# Patient Record
Sex: Male | Born: 1940 | Race: White | Hispanic: No | Marital: Married | Smoking: Former smoker
Health system: Southern US, Community
[De-identification: ages and names within clinical notes are randomized; demographics above are authoritative.]

## PROBLEM LIST (undated history)

## (undated) DIAGNOSIS — E039 Hypothyroidism, unspecified: Secondary | ICD-10-CM

## (undated) DIAGNOSIS — Z8739 Personal history of other diseases of the musculoskeletal system and connective tissue: Secondary | ICD-10-CM

## (undated) DIAGNOSIS — I2119 ST elevation (STEMI) myocardial infarction involving other coronary artery of inferior wall: Secondary | ICD-10-CM

## (undated) DIAGNOSIS — T7840XA Allergy, unspecified, initial encounter: Secondary | ICD-10-CM

## (undated) DIAGNOSIS — E785 Hyperlipidemia, unspecified: Secondary | ICD-10-CM

## (undated) DIAGNOSIS — M1990 Unspecified osteoarthritis, unspecified site: Secondary | ICD-10-CM

## (undated) DIAGNOSIS — I2510 Atherosclerotic heart disease of native coronary artery without angina pectoris: Secondary | ICD-10-CM

## (undated) DIAGNOSIS — I739 Peripheral vascular disease, unspecified: Secondary | ICD-10-CM

## (undated) HISTORY — DX: Hyperlipidemia, unspecified: E78.5

## (undated) HISTORY — DX: Personal history of other diseases of the musculoskeletal system and connective tissue: Z87.39

## (undated) HISTORY — PX: CORONARY ANGIOPLASTY WITH STENT PLACEMENT: SHX49

## (undated) HISTORY — DX: Peripheral vascular disease, unspecified: I73.9

## (undated) HISTORY — PX: APPENDECTOMY: SHX54

## (undated) HISTORY — DX: Hypothyroidism, unspecified: E03.9

## (undated) HISTORY — DX: Unspecified osteoarthritis, unspecified site: M19.90

## (undated) HISTORY — PX: CORONARY STENT PLACEMENT: SHX1402

## (undated) HISTORY — DX: ST elevation (STEMI) myocardial infarction involving other coronary artery of inferior wall: I21.19

## (undated) HISTORY — DX: Atherosclerotic heart disease of native coronary artery without angina pectoris: I25.10

## (undated) HISTORY — PX: HERNIA REPAIR: SHX51

## (undated) HISTORY — DX: Allergy, unspecified, initial encounter: T78.40XA

---

## 1999-05-28 DIAGNOSIS — I2119 ST elevation (STEMI) myocardial infarction involving other coronary artery of inferior wall: Secondary | ICD-10-CM

## 1999-05-28 HISTORY — DX: ST elevation (STEMI) myocardial infarction involving other coronary artery of inferior wall: I21.19

## 2000-04-07 ENCOUNTER — Inpatient Hospital Stay (HOSPITAL_COMMUNITY): Admission: EM | Admit: 2000-04-07 | Discharge: 2000-04-11 | Payer: Self-pay | Admitting: Emergency Medicine

## 2000-04-07 ENCOUNTER — Encounter: Payer: Self-pay | Admitting: Emergency Medicine

## 2000-04-08 HISTORY — PX: CARDIAC CATHETERIZATION: SHX172

## 2000-04-09 ENCOUNTER — Encounter: Payer: Self-pay | Admitting: Cardiology

## 2000-06-10 ENCOUNTER — Encounter (HOSPITAL_COMMUNITY): Admission: RE | Admit: 2000-06-10 | Discharge: 2000-09-08 | Payer: Self-pay | Admitting: *Deleted

## 2004-11-13 ENCOUNTER — Ambulatory Visit: Payer: Self-pay | Admitting: Family Medicine

## 2005-01-17 ENCOUNTER — Ambulatory Visit: Payer: Self-pay | Admitting: Family Medicine

## 2005-01-18 ENCOUNTER — Ambulatory Visit: Payer: Self-pay | Admitting: Family Medicine

## 2005-01-21 ENCOUNTER — Ambulatory Visit: Payer: Self-pay | Admitting: Family Medicine

## 2006-01-02 ENCOUNTER — Ambulatory Visit: Payer: Self-pay | Admitting: Internal Medicine

## 2006-01-07 ENCOUNTER — Ambulatory Visit: Payer: Self-pay | Admitting: Cardiology

## 2006-07-16 ENCOUNTER — Ambulatory Visit: Payer: Self-pay | Admitting: Internal Medicine

## 2006-07-16 LAB — CONVERTED CEMR LAB
ALT: 16 units/L (ref 0–40)
Albumin: 3.8 g/dL (ref 3.5–5.2)
BUN: 17 mg/dL (ref 6–23)
CO2: 30 meq/L (ref 19–32)
Calcium: 9.1 mg/dL (ref 8.4–10.5)
Chloride: 105 meq/L (ref 96–112)
Cholesterol: 213 mg/dL (ref 0–200)
Creatinine, Ser: 0.8 mg/dL (ref 0.4–1.5)
Direct LDL: 151.9 mg/dL
GFR calc Af Amer: 126 mL/min
GFR calc non Af Amer: 104 mL/min
Glucose, Bld: 97 mg/dL (ref 70–99)
HDL: 34.3 mg/dL — ABNORMAL LOW (ref >39.0)
Phosphorus: 2.7 mg/dL (ref 2.3–4.6)
Potassium: 4.4 meq/L (ref 3.5–5.1)
Sodium: 139 meq/L (ref 135–145)
Total CHOL/HDL Ratio: 6.2
Total CK: 64 units/L (ref 7–195)
Triglycerides: 144 mg/dL (ref 0–149)
VLDL: 29 mg/dL (ref 0–40)

## 2006-09-23 ENCOUNTER — Telehealth (INDEPENDENT_AMBULATORY_CARE_PROVIDER_SITE_OTHER): Payer: Self-pay | Admitting: *Deleted

## 2007-01-07 DIAGNOSIS — I2510 Atherosclerotic heart disease of native coronary artery without angina pectoris: Secondary | ICD-10-CM | POA: Insufficient documentation

## 2007-01-07 DIAGNOSIS — E785 Hyperlipidemia, unspecified: Secondary | ICD-10-CM | POA: Insufficient documentation

## 2007-01-07 DIAGNOSIS — I739 Peripheral vascular disease, unspecified: Secondary | ICD-10-CM

## 2007-01-13 ENCOUNTER — Telehealth (INDEPENDENT_AMBULATORY_CARE_PROVIDER_SITE_OTHER): Payer: Self-pay | Admitting: *Deleted

## 2007-03-11 ENCOUNTER — Ambulatory Visit: Payer: Self-pay | Admitting: Internal Medicine

## 2007-04-10 ENCOUNTER — Ambulatory Visit: Payer: Self-pay | Admitting: Internal Medicine

## 2007-04-10 DIAGNOSIS — J309 Allergic rhinitis, unspecified: Secondary | ICD-10-CM | POA: Insufficient documentation

## 2007-04-13 LAB — CONVERTED CEMR LAB
ALT: 13 units/L (ref 0–53)
Albumin: 3.3 g/dL — ABNORMAL LOW (ref 3.5–5.2)
BUN: 11 mg/dL (ref 6–23)
CO2: 28 meq/L (ref 19–32)
Calcium: 8.8 mg/dL (ref 8.4–10.5)
Chloride: 105 meq/L (ref 96–112)
Cholesterol: 189 mg/dL (ref 0–200)
Creatinine, Ser: 0.8 mg/dL (ref 0.4–1.5)
GFR calc Af Amer: 126 mL/min
GFR calc non Af Amer: 104 mL/min
Glucose, Bld: 101 mg/dL — ABNORMAL HIGH (ref 70–99)
HDL: 31.8 mg/dL — ABNORMAL LOW (ref >39.0)
LDL Cholesterol: 141 mg/dL — ABNORMAL HIGH (ref 0–99)
Phosphorus: 2.3 mg/dL (ref 2.3–4.6)
Potassium: 3.9 meq/L (ref 3.5–5.1)
Sodium: 139 meq/L (ref 135–145)
Total CHOL/HDL Ratio: 5.9
Triglycerides: 79 mg/dL (ref 0–149)
VLDL: 16 mg/dL (ref 0–40)

## 2008-03-11 ENCOUNTER — Telehealth: Payer: Self-pay | Admitting: Internal Medicine

## 2008-04-07 ENCOUNTER — Ambulatory Visit: Payer: Self-pay | Admitting: Internal Medicine

## 2008-04-07 DIAGNOSIS — M719 Bursopathy, unspecified: Secondary | ICD-10-CM

## 2008-04-07 DIAGNOSIS — M67919 Unspecified disorder of synovium and tendon, unspecified shoulder: Secondary | ICD-10-CM | POA: Insufficient documentation

## 2008-04-13 LAB — CONVERTED CEMR LAB
ALT: 15 units/L (ref 0–53)
AST: 18 units/L (ref 0–37)
Albumin: 3.6 g/dL (ref 3.5–5.2)
Alkaline Phosphatase: 68 units/L (ref 39–117)
BUN: 11 mg/dL (ref 6–23)
Basophils Absolute: 0.1 10*3/uL (ref 0.0–0.1)
Basophils Relative: 0.7 % (ref 0.0–3.0)
Bilirubin, Direct: 0.1 mg/dL (ref 0.0–0.3)
CO2: 29 meq/L (ref 19–32)
Calcium: 9.3 mg/dL (ref 8.4–10.5)
Chloride: 108 meq/L (ref 96–112)
Cholesterol: 199 mg/dL (ref 0–200)
Creatinine, Ser: 0.8 mg/dL (ref 0.4–1.5)
Eosinophils Absolute: 0.4 10*3/uL (ref 0.0–0.7)
Eosinophils Relative: 5.6 % — ABNORMAL HIGH (ref 0.0–5.0)
GFR calc Af Amer: 125 mL/min
GFR calc non Af Amer: 103 mL/min
Glucose, Bld: 91 mg/dL (ref 70–99)
HCT: 38.4 % — ABNORMAL LOW (ref 39.0–52.0)
HDL: 21.1 mg/dL — ABNORMAL LOW (ref >39.0)
Hemoglobin: 13.1 g/dL (ref 13.0–17.0)
LDL Cholesterol: 148 mg/dL — ABNORMAL HIGH (ref 0–99)
Lymphocytes Relative: 17.1 % (ref 12.0–46.0)
MCHC: 34.1 g/dL (ref 30.0–36.0)
MCV: 91.5 fL (ref 78.0–100.0)
Monocytes Absolute: 0.4 10*3/uL (ref 0.1–1.0)
Monocytes Relative: 5.4 % (ref 3.0–12.0)
Neutro Abs: 5.2 10*3/uL (ref 1.4–7.7)
Neutrophils Relative %: 71.2 % (ref 43.0–77.0)
Phosphorus: 3.8 mg/dL (ref 2.3–4.6)
Platelets: 347 10*3/uL (ref 150–400)
Potassium: 4.4 meq/L (ref 3.5–5.1)
RBC: 4.2 M/uL — ABNORMAL LOW (ref 4.22–5.81)
RDW: 14.2 % (ref 11.5–14.6)
Sodium: 141 meq/L (ref 135–145)
TSH: 9.27 microintl units/mL — ABNORMAL HIGH (ref 0.35–5.50)
Total Bilirubin: 0.6 mg/dL (ref 0.3–1.2)
Total CHOL/HDL Ratio: 9.4
Total Protein: 6.5 g/dL (ref 6.0–8.3)
Triglycerides: 152 mg/dL — ABNORMAL HIGH (ref 0–149)
VLDL: 30 mg/dL (ref 0–40)
WBC: 7.3 10*3/uL (ref 4.5–10.5)

## 2008-04-29 ENCOUNTER — Ambulatory Visit: Payer: Self-pay | Admitting: Internal Medicine

## 2008-04-29 DIAGNOSIS — E039 Hypothyroidism, unspecified: Secondary | ICD-10-CM

## 2008-04-29 DIAGNOSIS — R946 Abnormal results of thyroid function studies: Secondary | ICD-10-CM

## 2008-04-29 LAB — CONVERTED CEMR LAB
Free T4: 0.7 ng/dL (ref 0.6–1.6)
TSH: 7.92 microintl units/mL — ABNORMAL HIGH (ref 0.35–5.50)

## 2008-05-17 ENCOUNTER — Ambulatory Visit: Payer: Self-pay | Admitting: Internal Medicine

## 2008-05-17 DIAGNOSIS — M1990 Unspecified osteoarthritis, unspecified site: Secondary | ICD-10-CM | POA: Insufficient documentation

## 2008-05-25 ENCOUNTER — Encounter: Payer: Self-pay | Admitting: Internal Medicine

## 2008-06-21 ENCOUNTER — Telehealth: Payer: Self-pay | Admitting: Internal Medicine

## 2008-06-24 ENCOUNTER — Telehealth: Payer: Self-pay | Admitting: Internal Medicine

## 2008-06-27 ENCOUNTER — Ambulatory Visit: Payer: Self-pay | Admitting: Internal Medicine

## 2008-06-27 DIAGNOSIS — M129 Arthropathy, unspecified: Secondary | ICD-10-CM | POA: Insufficient documentation

## 2008-06-29 ENCOUNTER — Encounter (INDEPENDENT_AMBULATORY_CARE_PROVIDER_SITE_OTHER): Payer: Self-pay | Admitting: Family Medicine

## 2008-06-29 ENCOUNTER — Ambulatory Visit: Admission: RE | Admit: 2008-06-29 | Discharge: 2008-06-29 | Payer: Self-pay | Admitting: Family Medicine

## 2008-06-29 ENCOUNTER — Encounter: Payer: Self-pay | Admitting: Internal Medicine

## 2008-06-29 ENCOUNTER — Ambulatory Visit: Payer: Self-pay | Admitting: Surgery

## 2008-07-20 ENCOUNTER — Encounter: Payer: Self-pay | Admitting: Internal Medicine

## 2008-09-29 ENCOUNTER — Encounter: Payer: Self-pay | Admitting: Internal Medicine

## 2008-09-29 ENCOUNTER — Ambulatory Visit (HOSPITAL_COMMUNITY): Admission: RE | Admit: 2008-09-29 | Discharge: 2008-09-29 | Payer: Self-pay | Admitting: Rheumatology

## 2008-11-23 ENCOUNTER — Encounter: Payer: Self-pay | Admitting: Internal Medicine

## 2008-11-24 HISTORY — PX: OTHER SURGICAL HISTORY: SHX169

## 2008-11-29 ENCOUNTER — Ambulatory Visit: Payer: Self-pay | Admitting: *Deleted

## 2008-12-05 ENCOUNTER — Encounter: Payer: Self-pay | Admitting: Internal Medicine

## 2008-12-06 ENCOUNTER — Encounter: Payer: Self-pay | Admitting: Internal Medicine

## 2008-12-08 ENCOUNTER — Encounter: Payer: Self-pay | Admitting: Internal Medicine

## 2009-01-05 ENCOUNTER — Encounter: Payer: Self-pay | Admitting: Internal Medicine

## 2009-01-17 ENCOUNTER — Encounter (INDEPENDENT_AMBULATORY_CARE_PROVIDER_SITE_OTHER): Payer: Self-pay | Admitting: Orthopedic Surgery

## 2009-01-17 ENCOUNTER — Ambulatory Visit (HOSPITAL_BASED_OUTPATIENT_CLINIC_OR_DEPARTMENT_OTHER): Admission: RE | Admit: 2009-01-17 | Discharge: 2009-01-17 | Payer: Self-pay | Admitting: Orthopedic Surgery

## 2009-02-08 ENCOUNTER — Encounter: Payer: Self-pay | Admitting: Internal Medicine

## 2009-02-14 ENCOUNTER — Ambulatory Visit (HOSPITAL_BASED_OUTPATIENT_CLINIC_OR_DEPARTMENT_OTHER): Admission: RE | Admit: 2009-02-14 | Discharge: 2009-02-14 | Payer: Self-pay | Admitting: Orthopedic Surgery

## 2009-02-17 ENCOUNTER — Encounter: Payer: Self-pay | Admitting: Internal Medicine

## 2009-02-20 ENCOUNTER — Encounter: Admission: RE | Admit: 2009-02-20 | Discharge: 2009-02-20 | Payer: Self-pay | Admitting: Endocrinology

## 2009-03-17 ENCOUNTER — Encounter: Payer: Self-pay | Admitting: Internal Medicine

## 2009-04-05 ENCOUNTER — Encounter: Payer: Self-pay | Admitting: Internal Medicine

## 2009-04-13 ENCOUNTER — Ambulatory Visit: Payer: Self-pay | Admitting: Internal Medicine

## 2009-04-13 DIAGNOSIS — D649 Anemia, unspecified: Secondary | ICD-10-CM

## 2009-04-13 DIAGNOSIS — M353 Polymyalgia rheumatica: Secondary | ICD-10-CM

## 2009-04-14 ENCOUNTER — Telehealth: Payer: Self-pay | Admitting: Internal Medicine

## 2009-04-14 LAB — CONVERTED CEMR LAB
Basophils Absolute: 0 10*3/uL (ref 0.0–0.1)
Basophils Relative: 0.2 % (ref 0.0–3.0)
Eosinophils Absolute: 0.6 10*3/uL (ref 0.0–0.7)
Eosinophils Relative: 10.8 % — ABNORMAL HIGH (ref 0.0–5.0)
Ferritin: 101.8 ng/mL (ref 22.0–322.0)
Folate: 10.6 ng/mL
HCT: 37.6 % — ABNORMAL LOW (ref 39.0–52.0)
Hemoglobin: 12.3 g/dL — ABNORMAL LOW (ref 13.0–17.0)
Iron: 21 ug/dL — ABNORMAL LOW (ref 42–165)
Lymphocytes Relative: 17.7 % (ref 12.0–46.0)
Lymphs Abs: 1 10*3/uL (ref 0.7–4.0)
MCHC: 32.6 g/dL (ref 30.0–36.0)
MCV: 87.2 fL (ref 78.0–100.0)
Monocytes Absolute: 0.8 10*3/uL (ref 0.1–1.0)
Monocytes Relative: 15.3 % — ABNORMAL HIGH (ref 3.0–12.0)
Neutro Abs: 3.1 10*3/uL (ref 1.4–7.7)
Neutrophils Relative %: 56 % (ref 43.0–77.0)
Platelets: 419 10*3/uL — ABNORMAL HIGH (ref 150.0–400.0)
RBC: 4.31 M/uL (ref 4.22–5.81)
RDW: 16.1 % — ABNORMAL HIGH (ref 11.5–14.6)
Saturation Ratios: 8.7 % — ABNORMAL LOW (ref 20.0–50.0)
Transferrin: 171.6 mg/dL — ABNORMAL LOW (ref 212.0–360.0)
Vitamin B-12: 324 pg/mL (ref 211–911)
WBC: 5.5 10*3/uL (ref 4.5–10.5)

## 2009-04-26 ENCOUNTER — Encounter: Payer: Self-pay | Admitting: Internal Medicine

## 2009-05-08 ENCOUNTER — Encounter (INDEPENDENT_AMBULATORY_CARE_PROVIDER_SITE_OTHER): Payer: Self-pay | Admitting: *Deleted

## 2009-07-27 ENCOUNTER — Encounter (INDEPENDENT_AMBULATORY_CARE_PROVIDER_SITE_OTHER): Payer: Self-pay | Admitting: *Deleted

## 2009-08-25 ENCOUNTER — Ambulatory Visit: Payer: Self-pay | Admitting: Gastroenterology

## 2009-11-16 ENCOUNTER — Ambulatory Visit: Payer: Self-pay | Admitting: Internal Medicine

## 2009-11-16 ENCOUNTER — Encounter (INDEPENDENT_AMBULATORY_CARE_PROVIDER_SITE_OTHER): Payer: Self-pay | Admitting: *Deleted

## 2009-11-20 LAB — CONVERTED CEMR LAB
ALT: 14 units/L (ref 0–53)
AST: 20 units/L (ref 0–37)
Cholesterol: 194 mg/dL (ref 0–200)
Free T4: 0.74 ng/dL (ref 0.60–1.60)
HDL: 34.2 mg/dL — ABNORMAL LOW (ref >39.00)
PSA: 0.44 ng/mL (ref 0.10–4.00)
Total Protein: 6.9 g/dL (ref 6.0–8.3)
VLDL: 25.6 mg/dL (ref 0.0–40.0)

## 2009-12-11 ENCOUNTER — Ambulatory Visit: Payer: Self-pay | Admitting: Internal Medicine

## 2009-12-13 ENCOUNTER — Encounter (INDEPENDENT_AMBULATORY_CARE_PROVIDER_SITE_OTHER): Payer: Self-pay | Admitting: *Deleted

## 2009-12-13 LAB — CONVERTED CEMR LAB: Fecal Occult Bld: NEGATIVE

## 2009-12-18 ENCOUNTER — Telehealth: Payer: Self-pay | Admitting: Internal Medicine

## 2010-02-09 ENCOUNTER — Encounter: Payer: Self-pay | Admitting: Internal Medicine

## 2010-02-09 ENCOUNTER — Ambulatory Visit: Payer: Self-pay | Admitting: Cardiology

## 2010-06-26 NOTE — Letter (Signed)
Summary: CMA Hemoccult Letter  [facility]  [address] [place], [place] [address]   Phone: [phone]  Fax: [phone]         November 16, 2009 MRN: [id number]    Darin [name] [address] [place], [place]  [address]    Dear Mr. Apperson,     Despite repeated attempts, I have been unable to reach you by phone. At your last visit, Dr. Christella Hartigan requested that you complete the hemoccult cards given to you at your last visit. However, as of yet, we have not received them. Please follow the instructions on the inside cover and return them as soon as possible.If you have misplaced the hemoccult cards, please call me at [phone] and I will mail you new cards. Your health is very important to [place].These tests will help ensure that Dr. Christella Hartigan has all the information at his disposal to make a complete diagnosis for you.  Thank you for your prompt attention to this matter.   Sincerely,    Chales Abrahams CMA (AAMA)  Appended Document: CMA Hemoccult Letter letter mailed

## 2010-06-26 NOTE — Letter (Signed)
Summary: [facility]: Immunoassay Fecal Occult Blood (iFOB) Order Form  [facility]  [address] [place], [place] [address]   Phone: [phone]  Fax: [phone]      [facility]: Immunoassay Fecal Occult Blood (iFOB) Order Form   November 16, 2009 MRN: [id number]   Darin [name] 03-15-1941   Physicican Name:________[place]_________________  Diagnosis Code:_________V76.51_________________      Cindee Salt MD

## 2010-06-26 NOTE — Assessment & Plan Note (Signed)
History of Present Illness Visit Type: Initial Visit Primary GI MD: Rob Bunting MD Primary Provider: Tillman Abide, MD Requesting Provider: Tillman Abide, MD Chief Complaint: iron deficiency anemia History of Present Illness:     very pleasant 70 year old Darin [name] who was recently found to have mild anemia, hemoglobin 11.9. Iron studies were done and this showed that he does have low iron levels, his ferritin however is normal. She has no overt GI symptoms. He has had no bleeding, constipation, diarrhea. He has never had colonoscopy. I don't think that is never completed stool cards.           Current Medications (verified): 1)  Altace 10 Mg Caps (Ramipril) .Marland Kitchen.. 1 Daily 2)  Lopressor 50 Mg Tabs (Metoprolol Tartrate) .... 1/2 Twice A Day 3)  Simvastatin 40 Mg Tabs (Simvastatin) .Marland Kitchen.. 1 Daily 4)  Aspirin 325 Mg  Tbec (Aspirin) .... Take One By Mouth Once A Day 5)  Multivitamins   Tabs (Multiple Vitamin) .... Take One By Mouth Once A Day 6)  Vitamin C  Allergies (verified): 1)  Lipitor (Atorvastatin Calcium) 2)  Codeine Sulfate (Codeine Sulfate)  Past History:  Past Medical History: Coronary artery disease Hyperlipidemia Peripheral vascular disease Allergic rhinitis Hypothyroidism Osteoarthritis Polymyalgia rheumatica? Seronegative rheumatoid arthritis?   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  All other review of systems was otherwise  negative.   Vital Signs:  Patient profile:   70 year old male Height:      67 inches Weight:      216 pounds BMI:     33.95 Pulse rate:   84 / minute Pulse rhythm:   regular BP sitting:   138 / 84  (left arm)  Vitals Entered By: Milford Cage NCMA (August 25, 2009 9:39 AM)  Physical Exam  Additional Exam:  Constitutional: generally well appearing Psychiatric: alert and oriented times 3 Eyes: extraocular movements intact Mouth: oropharynx moist, no lesions Neck: supple, no lymphadenopathy Cardiovascular: heart regular rate and rythm Lungs: CTA bilaterally Abdomen: soft, non-tender, non-distended, no obvious ascites, no peritoneal signs, normal bowel sounds Extremities: no lower extremity edema bilaterally Skin: no lesions on visible extremities    Impression & Recommendations:  Problem # 1:  Iron deficiency anemia, mild he is a very reluctant to undergo colon cancer screening. I recommended him in no one clear terms that he should have a colonoscopy since he is iron deficient. I told him he may have a cancer that is causing chronic GI blood loss leading to anemia. He is still very reluctant. I did have a very pleasant conversation with him about this and he agreed to think about my recommendation. I did offer him fecal occult blood testing which she will complete forms. I let him know that my recommendation following the fecal occult testing will still be the same, that is I will recommend he needs a colonoscopy. However he believes that if he has microscopic blood in his  stool, proven by one of these tests, he will agree to a colonoscopy.  Patient Instructions: 1)  Dr. Christella Hartigan recommends that you have a colonoscopy.  Since you have declined, please complete 2 sets of stools cards for microscpic blood in your stool. 2)  A copy of this information will be sent to Dr. Alphonsus Sias. 3)  The medication list was reviewed and reconciled.  All changed / newly prescribed medications were  explained.  A complete medication list was provided to the patient / caregiver.  Appended Document: Orders Update/Stool cards    Clinical Lists Changes  Orders: Added new Test order of [facility] Hemoccult Cards #6 (take home) (Hem Cards #6) - Signed

## 2010-06-26 NOTE — Letter (Signed)
Summary: Letter Regarding Disease Mgmt Program/[facility]  Letter Regarding Disease Mgmt Program/[facility]   Imported By: Lanelle Bal 12/01/2009 11:25:06  _____________________________________________________________________  External Attachment:    Type:   Image     Comment:   External Document

## 2010-06-26 NOTE — Letter (Signed)
Summary: Results Follow up Letter  [facility]  [address]   [place], [place] [address]   Phone: [phone]  Fax: [phone]    12/13/2009 MRN: [id number]  Darin [name] [address] [place], [place]  [address]  Dear Mr. Fogarty,  The following are the results of your recent test(s):  Test         Result    Pap Smear:        Normal _____  Not Normal _____ Comments: ______________________________________________________ Cholesterol: LDL(Bad cholesterol):         Your goal is less than:         HDL (Good cholesterol):       Your goal is more than: Comments:  ______________________________________________________ Mammogram:        Normal _____  Not Normal _____ Comments:  ___________________________________________________________________ Hemoccult:        Normal __x___  Not normal _______ Comments:   Will repeat again in 1 year _____________________________________________________________________ Other Tests:    We routinely do not discuss normal results over the telephone.  If you desire a copy of the results, or you have any questions about this information we can discuss them at your next office visit.   Sincerely,      Tillman Abide, MD

## 2010-06-26 NOTE — Assessment & Plan Note (Signed)
Summary: CPX / LFW   Vital Signs:  Patient profile:   70 year old male Weight:      214 pounds Temp:     98.4 degrees F oral Pulse rate:   76 / minute Pulse rhythm:   regular BP sitting:   110 / 60  (left arm) Cuff size:   large  Vitals Entered By: Mervin Hack CMA Duncan Dull) (November 16, 2009 2:06 PM) CC: adult physical   History of Present Illness: Discussed his hand and joint problems still on prednisone though weaning down still has swelling in joints Dr Dagoberto Ligas hopes that will just get better muscle pain is much better on the androgel Still symptomatic but better  Last Hgb 13 at Dr Dagoberto Ligas the last time  No other concerns  Preventive Screening-Counseling & Management  Alcohol-Tobacco     Smoking Status: quit > 6 months     Smoking Cessation Counseling: no  Allergies: 1)  Lipitor (Atorvastatin Calcium) 2)  Codeine Sulfate (Codeine Sulfate)  Past History:  Past medical, surgical, family and social histories (including risk factors) reviewed for relevance to current acute and chronic problems.  Past Medical History: Reviewed history from 08/25/2009 and no changes required. Coronary artery disease Hyperlipidemia Peripheral vascular disease Allergic rhinitis Hypothyroidism Osteoarthritis Polymyalgia rheumatica? Seronegative rheumatoid arthritis?   Past Surgical History: Reviewed history from 08/25/2009 and no changes required. Stents x 3 11/01 Appendix 1962 RIH 1980's Carpal tunnel repair bilaterally--2010 --  Dr Teressa Senter    Family History: Reviewed history from 08/25/2009 and no changes required. Father: Died at age 15, lung cancer, had HTN Mother: Alive-- osteoporosis, HTN 1 brother --has HTN DM in Mat GF No prostate or colon cancer   Social History: Reviewed history from 08/25/2009 and no changes required. 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 Eyes:  Denies double vision and vision loss-1 eye. ENT:  Denies decreased hearing and ringing in ears; teeth okay--regular with dentist. CV:  Denies chest pain or discomfort, difficulty breathing at night, difficulty breathing while lying down, fainting, lightheadness, palpitations, and shortness of breath with exertion. Resp:  Denies cough and shortness of breath. GI:  Denies abdominal pain, bloody stools, change in bowel habits, dark tarry stools, indigestion, loss of appetite, nausea, and vomiting. GU:  Complains of erectile dysfunction; denies urinary frequency and urinary hesitancy; mild ED. MS:  See HPI; Complains of joint pain, joint swelling, muscle aches, and muscle weakness. Derm:  Complains of dryness; denies lesion(s) and rash. Neuro:  Complains of weakness; denies headaches, numbness, and tingling; notes some trouble getting up from floor if on his knees. Psych:  Denies anxiety and depression. Heme:  Denies abnormal bruising and enlarge lymph nodes. Allergy:  Complains of seasonal allergies and sneezing; trouble when mowing--benedryl not much help.  Physical Exam  General:  alert and normal appearance.   Eyes:  pupils equal, pupils round, and pupils reactive to light.   Ears:  R ear normal and L ear normal.   Mouth:  no erythema, no exudates, and no lesions.   Neck:  supple, no masses, no thyromegaly, no carotid bruits, and no cervical lymphadenopathy.   Lungs:  normal respiratory effort and normal breath sounds.  Heart:  normal rate, regular rhythm, no murmur, and no gallop.   Abdomen:  soft, non-tender, and no masses.   Rectal:  he preferred not to have this exam Msk:  some swelling in right wrist and MCPs, and left knee Pulses:  1+ in  feet Extremities:  no edema Neurologic:  alert & oriented X3 and gait normal.   Skin:  no rashes and no suspicious lesions.   Axillary Nodes:  No palpable lymphadenopathy Psych:  normally interactive, good eye contact, not anxious appearing, and not depressed appearing.     Impression & Recommendations:  Problem # 1:  PREVENTIVE HEALTH CARE (ICD-V70.0) Assessment Comment Only still prefers not to have colonoscopy will give stool immunoassay since it is easier than hemoccult cards will check PSA  Problem # 2:  POLYMYALGIA RHEUMATICA (ICD-725) Assessment: Comment Only better but complex situation joint swelling doesn't necessarily go along with this Dr Dagoberto Ligas managing  His updated medication list for this problem includes:    Prednisone 5 Mg Tabs (Prednisone) .Marland Kitchen... Take 1/2 tab by mouth once daily    Aspirin 325 Mg Tbec (Aspirin) .Marland Kitchen... Take one by mouth once a day  Problem # 3:  CORONARY ARTERY DISEASE (ICD-414.00) Assessment: Comment Only quiet Dr [place] manages  His updated medication list for this problem includes:    Altace 10 Mg Caps (Ramipril) .Marland Kitchen... 1 daily    Lopressor 50 Mg Tabs (Metoprolol tartrate) .Marland Kitchen... 1/2 twice a day    Aspirin 325 Mg Tbec (Aspirin) .Marland Kitchen... Take one by mouth once a day  Problem # 4:  HYPERLIPIDEMIA (ICD-272.4) Assessment: Unchanged  will check labs  His updated medication list for this problem includes:    Simvastatin 40 Mg Tabs (Simvastatin) .Marland Kitchen... 1 daily  Labs Reviewed: SGOT: 18 (04/07/2008)   SGPT: 15 (04/07/2008)  Prior 10 Yr Risk Heart Disease: N/A (04/13/2009)   HDL:21.1 (04/07/2008), 31.8 (04/10/2007)  LDL:148 (04/07/2008), 141 (04/10/2007)  Chol:199 (04/07/2008), 189 (04/10/2007)  Trig:152 (04/07/2008), 79 (04/10/2007)  Orders: TLB-Lipid Panel (80061-LIPID) TLB-Hepatic/Liver Function Pnl (80076-HEPATIC) Venipuncture ([address])  Complete Medication List: 1)  Altace 10 Mg Caps (Ramipril) .Marland Kitchen.. 1 daily 2)  Lopressor 50 Mg Tabs  (Metoprolol tartrate) .... 1/2 twice a day 3)  Simvastatin 40 Mg Tabs (Simvastatin) .Marland Kitchen.. 1 daily 4)  Vitamin C 500 Mg Tabs (Ascorbic acid) .... Take 1 by mouth once daily 5)  Prednisone 5 Mg Tabs (Prednisone) .... Take 1/2 tab by mouth once daily 6)  Androgel 25 Mg/2.5gm Gel (Testosterone) .... As needed 7)  Aspirin 325 Mg Tbec (Aspirin) .... Take one by mouth once a day 8)  Multivitamins Tabs (Multiple vitamin) .... Take one by mouth once a day  Other Orders: TLB-T4 (Thyrox), Free [phone]) TLB-PSA (Prostate Specific Antigen) (84153-PSA)  Patient Instructions: 1)  Please schedule a follow-up appointment in 1 year.  2)  Complete your hemoccult cards and return them soon.   Current Allergies (reviewed today): LIPITOR (ATORVASTATIN CALCIUM) CODEINE SULFATE (CODEINE SULFATE)

## 2010-06-26 NOTE — Progress Notes (Signed)
Summary: inner ear  Phone Note Call from Patient Call back at Home Phone [phone]   Caller: Spouse Call For: Cindee Salt MD Summary of Call: Wife called saying that patient is having trouble with inner ear since yesterday. Wife is asking if he could have something called [place] without coming in for appt. because he was just in last week. Please advise.  Initial call taken by: Melody Comas,  December 18, 2009 11:14 AM  Follow-up for Phone Call        If he is having vertigo or balance issues, he can try meclizine 25mg  three times a day. THis is OTC and sold under various brand names for motion sickness (like bonine) Appt if worsens or not getting better over time Follow-up by: Cindee Salt MD,  December 18, 2009 12:40 PM  Additional Follow-up for Phone Call Additional follow up Details #1::        Patient's wife notified as instructed by telephone. Additional Follow-up by: Sydell Axon LPN,  December 18, 2009 2:21 PM

## 2010-06-26 NOTE — Letter (Signed)
Summary: New Patient letter  [facility]  [address] [place], [place] [address]   Phone: [phone]  Fax: [phone]       07/27/2009 MRN: [id number]  Darin [name] [address] [place], [place]  [address]  Dear Darin [name],  Welcome to the Gastroenterology Division at [facility].    You are scheduled to see Dr. Christella Hartigan on 08-25-09 at 10:00a.m. on the 3rd floor at [facility], [address]  We ask that you try to arrive at our office 15 minutes prior to your appointment time to allow for check-in.  We would like you to complete the enclosed self-administered evaluation form prior to your visit and bring it with you on the day of your appointment.  We will review it with you.  Also, please bring a complete list of all your medications or, if you prefer, bring the medication bottles and we will list them.  Please bring your insurance card so that we may make a copy of it.  If your insurance requires a referral to see a specialist, please bring your referral form from your primary care physician.  Co-payments are due at the time of your visit and may be paid by cash, check or credit card.     Your office visit will consist of a consult with your physician (includes a physical exam), any laboratory testing he/she may order, scheduling of any necessary diagnostic testing (e.g. x-ray, ultrasound, CT-scan), and scheduling of a procedure (e.g. Endoscopy, Colonoscopy) if required.  Please allow enough time on your schedule to allow for any/all of these possibilities.    If you cannot keep your appointment, please call [phone] to cancel or reschedule prior to your appointment date.  This allows [place] the opportunity to schedule an appointment for another patient in need of care.  If you do not cancel or reschedule by 5 p.m. the business day prior to your appointment date, you will be charged a $50.00 late cancellation/no-show fee.    Thank you for choosing [facility] for your medical needs.  We appreciate the opportunity to care for you.  Please visit [place] at our website  to learn more about our practice.                     Sincerely,                                                             The Gastroenterology Division

## 2010-06-26 NOTE — Letter (Signed)
Summary: [facility]   Imported By: Lester Granite 02/22/2010 07:29:06  _____________________________________________________________________  External Attachment:    Type:   Image     Comment:   External Document  Appended Document: [facility] No changes 6 month follow up

## 2010-08-31 LAB — BASIC METABOLIC PANEL
BUN: 9 mg/dL (ref 6–23)
CO2: 26 mEq/L (ref 19–32)
Chloride: 104 mEq/L (ref 96–112)
Glucose, Bld: 97 mg/dL (ref 70–99)
Potassium: 4 mEq/L (ref 3.5–5.1)
Sodium: 138 mEq/L (ref 135–145)

## 2010-09-01 LAB — BASIC METABOLIC PANEL
BUN: 10 mg/dL (ref 6–23)
CO2: 25 mEq/L (ref 19–32)
Chloride: 105 mEq/L (ref 96–112)
Glucose, Bld: 125 mg/dL — ABNORMAL HIGH (ref 70–99)
Potassium: 4.6 mEq/L (ref 3.5–5.1)
Sodium: 137 mEq/L (ref 135–145)

## 2010-09-01 LAB — POCT HEMOGLOBIN-HEMACUE
Hemoglobin: 11 g/dL — ABNORMAL LOW (ref 13.0–17.0)
Hemoglobin: 8.9 g/dL — ABNORMAL LOW (ref 13.0–17.0)

## 2010-09-03 ENCOUNTER — Encounter: Payer: Self-pay | Admitting: Internal Medicine

## 2010-10-09 NOTE — Op Note (Signed)
NAME:  [name], INKS NO.:  [id number]   MEDICAL RECORD NO.:  [id number]          PATIENT TYPE:  AMB   LOCATION:  [facility]SC                          FACILITY:  [facility]   PHYSICIAN:  Katy Fitch. Sypher, M.D. DATE OF BIRTH:  08-21-1940   DATE OF PROCEDURE:  01/17/2009  DATE OF DISCHARGE:                               OPERATIVE REPORT   PREOPERATIVE DIAGNOSIS:  Severe right carpal tunnel syndrome with  background seronegative inflammatory arthritis, rule out rheumatoid  arthritis.   POSTOPERATIVE DIAGNOSIS:  Profound inflammatory tenosynovitis with very  severe right carpal tunnel syndrome.   OPERATION:  1. Synovectomy for culture, crystal examination, and histopathology,      right ulnar bursa.  2. Decompression of right median nerve at carpal tunnel.   OPERATING SURGEON:  Katy Fitch. Sypher, MD.   ASSISTANT:  Darin Reeks Dasnoit, PA.   ANESTHESIA:  General by LMA.   SUPERVISING ANESTHESIOLOGIST:  Bedelia Person, MD   INDICATIONS:  Darin [name] is a 70 year old gentleman referred through  the courtesy of Dr. Alphonsus Sias and Dr. Renae Fickle.   He has been undergoing a 21-month medical evaluation for arthralgia,  swelling, pain, and numbness.  He has been seen by Dr. Corliss Skains, who  diagnosed seronegative rheumatoid arthritis.  He was advised to use  Plaquenil, methotrexate, and episodic prednisone.  Prednisone  transiently relieved his symptoms.  He was seen by Dr. Renae Fickle for a second  opinion.  Dr. Renae Fickle noted severe carpal tunnel syndrome, treated with him  steroid injections.  He then referred him to Dr. Regino Schultze for  electrodiagnostic studies.  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   Darin [name] was referred for Hand Surgery consult to consider carpal  tunnel release.  Upon evaluation, it was clear that he had an  inflammatory disorder,  probably rheumatoid arthritis.  He did have an  elevated C-reactive protein obtained by Dr. Dagoberto Ligas.  He does have  background hypothyroidism compensated on levothyroxine.   We advised proceeding with synovial biopsy and decompression of the  median nerve at this time.   We are very careful to point out preoperatively that carpal tunnel  surgery is for symptomatic relief of his burning hand pain and numbness.  It will not address his underlying arthritis.  We urged him to return to  see Dr. Corliss Skains for continuation of treatment of his rheumatoid  arthritis.   Questions were invited and answered in detail both in the office and in  the holding area.   PROCEDURE:  Darin [name] was brought to the operating room and  placed in supine position on the operating table.   Following the induction of general anesthesia by LMA, the right arm was  prepped with Betadine soap and solution, sterilely draped.  A pneumatic  tourniquet was applied to the proximal right brachium.   Following exsanguination of the right arm with an Esmarch bandage, the  arterial tourniquet was inflated to 220 mmHg.  Procedure commenced with  a  short incision in the line of the ring finger in the palm.  Subcutaneous tissues were carefully divided taking care to identify the  palmar fascia.  This was split with scissors to reveal the common  sensory branch of the median nerve in the thenar musculature.  There was  a distal thenar muscle, therefore the muscle fibers were teased apart to  look for aberrant motor branch.  The ulnar bursa was identified and  found be extremely edematous, hypervascular, and a bulging distal to the  margin of the transcarpal ligament.   Transcarpal ligament was released along its ulnar border extending to  this forearm.  This allowed wide spread of the leaflets of the  transverse carpal ligament and immediately inflammatory tenosynovium  fluid was encountered.   This was cultured for  aerobic growth.  A generous synovial biopsy was  accomplished by complete tenosynovectomy of the superficialis profundus  fingers of the long, ring, and small fingers.  This was apportioned into  absolute alcohol for crystal exam and sent in formalin for formal  histopathologic evaluation.   Bleeding points were electrocauterized with bipolar current followed by  exploration to be certain that there are no other masses or predicaments  in the canal.  The wound was then repaired with intradermal 3-0 Prolene.  A compressive dressing was applied with Steri-Strips, sterile gauze, and  a volar plaster splint.   Lidocaine 2% was infiltrated along the wound margins for postoperative  analgesia.   Darin [name] tolerated the surgery and anesthesia well.  He was discharged  with a prescription for Percocet 5 mg one p.o. q.4-6 hours p.r.n. pain.  He needs to contact Dr. Corliss Skains urgently for followup evaluation.  He  may benefit from postoperative steroid; however, due to infection risk,  we will not begin prednisone immediately.   Questions regarding his procedure were invited and answered in detail  with his wife postoperatively.   I will see him back for followup in 7 days for dressing change.      Katy Fitch Sypher, M.D.  Electronically Signed     RVS/MEDQ  D:  01/17/2009  T:  01/17/2009  Job:  [id number]   cc:   Pollyann Savoy, M.D.  Alfonse Alpers. Dagoberto Ligas, M.D.  Deidre Ala, M.D.  Karie Schwalbe, MD

## 2010-10-12 NOTE — Cardiovascular Report (Signed)
[place]  Patient:    TANVIR, HIPPLE                     MRN: [id number] Proc. Date: 04/07/00 Adm. Date:  [id number] Disc. Date: [id number] Attending:  Pricilla Riffle CC:         Dietrich Pates, M.D. [facility]  [facility]   Cardiac Catheterization  PROCEDURES PERFORMED: 1.  Left heart catheterization. 2.  Left ventriculogram. 3.  Selective coronary angiography. 4.  Percutaneous transluminal coronary angioplasty and stenting of the mid     right coronary artery. 5.  Percutaneous transluminal coronary angioplasty of the mid AV circumflex.  DIAGNOSES: 1.  Severe two-vessel coronary artery disease. 2.  Moderate left ventricular systolic dysfunction. 3.  Acute inferior wall myocardial infarction.  HISTORY:  Mr. Olgin is a 70 year old white male without prior cardiac history who presents with substernal chest discomfort.  The patient had a one month history of mid chest pain for which he has been taking antacids.  This morning he had severe pain starting at 9 a.m., which prompted his eventual presentation to his physicians at [facility].  There, he was found to have ECG changes consistent with evolving inferior wall myocardial infarction and he was sent emergently to [facility].  As he was continuing to have chest discomfort with subtle ST elevation in the inferior leads and with evidence of evolving Q-wave inferior and inferolateral infarction, he was brought to the catheterization laboratory for cardiac catheterization.  TECHNIQUE:  After informed consent was obtained, the patient was brought to the catheterization laboratory where both groins were sterilely prepped and draped.  One percent Lidocaine was used to infiltrate the right groin and a 7-French sheath was placed in the right femoral artery using the modified Seldinger technique.  Six [place] JL-4 and JR-4 catheters were then used to engage the  left and right coronary arteries and selective angiography was performed in various projections using manual injections of contrast.  A 6-French pigtail catheter was then advanced into the left ventricle and a left ventriculogram was performed using power injections of contrast.  RESULTS:  The initial findings are as follows: 1.  The left main is a medium caliber vessel with a focal pinch of 30% in the     mid section. 2.  The left anterior descending artery is a small caliber vessel which     provides three diagonal branches.  There is mild diffuse disease in the     LAD system of 30% to 40%. 3.  The left circumflex artery is a medium caliber vessel that provides two     trivial marginal branches in the mid section and a large distal marginal     branch.  The left circumflex system has a high grade narrowing of 90% in     the mid AV section between the two trivial marginal branches, which is     followed by a hazy 70% narrowing.  The remainder of the left circumflex     has mild diffuse disease. 4.  The right coronary artery is dominant.  This begins as a large caliber     vessel and tapers in the proximal segment to a 100% occlusion.  The mid     and distal sections of the vessel will be seen later.  The mid RCA will     have severe diffuse disease along its course descending into the distal  RCA with a long 60% to 70% narrowing up to and encroaching into the ostium     of the posterior descending artery.  Mild irregularities were then noted     in the distal RCA path and posterior descending artery extending into     small posteroventricular branches.  The PDA has mild irregularities. 5.  Left ventricle:  Mildly dilated in systolic and end-diastolic dimensions.     Overall left ventricular function is moderately impaired.  Ejection     fraction is approximately 30%.  There is akinesis of the inferior wall.     Left ventricular pressure is 114/10, aorta is 114/73.  LV-EDP is 17.   No     mitral regurgitation is noted.  DESCRIPTION OF PROCEDUER:  These findings were reviewed with the patient and we elected to proceed with percutaneous intervention to the right coronary artery as well as the left circumflex.  The patient was given aspirin and heparin, as well as 300 mg of Plavix and a ReoPro bolus and infusion per protocol.  ACT was maintained above 300 seconds.  A 7-French JR-4 guide catheter was used to engage the right coronary artery and a 0.014 inch high-torque floppy wire was advanced with an 18.5 x 20 mm Maverick balloon. This was carefully advanced into the distal RCA, as was the balloon.  No flow was noted around the balloon and angiography through the balloon lumen showed positioning of the wire and balloon within the true vessel.  The wire was replaced and the Maverick balloon was used to predilate the mid RCA.  Three inflations were performed at 6 atm x 30 seconds. Repeat angiography showed improvement in the vessel flow with severe residual disease in the mid and distal right coronary artery.  A 3.0 x 20 mm Quantum monorail balloon was introduced and a single inflation was performed in the mid section at 12 atm x 30 seconds with three additional inflations performed at 12 atm x 60 seconds, extending up to the area of occlusion.  Repeat angiography showed significant improvement in vessel lumen, although there was recoil and persistent disease in the mid section between to RV marginal branches.  A 3.5 x 20 mm Quantum monorail balloon was introduced and three inflations performed at 12 atm x 60 seconds.  While vessel lumen improved and flow is now TIMI-3, there was significant recoil in the mid RCA as well as severe residual disease and evidence for a type A dissection just prior to the second marginal branch.  We elected to proceed with stenting of the mid RCA.  A 3.5 x 28 mm Penta stent was carefully positioned in the mid RCA, extending up to the second  RV marginal branch and deployed at 12 atm x 30 seconds.  A  3.5 x 13 mm Penta stent was introduced and positioned just proximal to the previously placed stent at the site of occlusion and deployed at 12 atm for 30 seconds.  The stent delivery balloon was then used to postdilate the mid section of the 28 mm Penta stent, where some mild residual waste was noted, as well as the junction of the two stents in the proximal stent at 16 atm for 30 seconds x a total of three additional inflations.  Repeat angiography now showed an excellent result with no residual stenosis in the mid RCA and TIMI-3 flow throughout the right coronary system.  There was severe residual disease in the distal RCA and the proximal PDA, however, it was  felt best to leave this at this time due to the significant length of lesion and the risk of restenosis.  Attention was then turned to the left circumflex artery.  Initially, a 7-French JL-4 guide catheter was introduced, however, damping of wave form was noted and this was exchanged for a catheter with sideholes.  The floppy wire was reintroduced and extended into the distal section of the third marginal branch and a 3.0 x 18 mm Penta stent positioned under cineangiography between the two trivial marginal branches.  This was primarily deployed at 12 atm for 30 seconds.  Repeat angiography after the administration of intracoronary nitroglycerin showed an excellent result with no residual stenosis.  There was evidence of a step-up and step-down around the stent site indicative of the diffuse extent of his coronary disease.  There was TIMI-3 flow throughout the left coronary system with no evidence of distal vessel damage.  This was deemed an acceptable result.  The guide catheter was then removed and the sheath secured into position.  The patient tolerated the procedure well and was transferred to the ICU in stable condition.  FINAL RESULT: 1.  Successful percutaneous  transluminal coronary angioplasty and stenting of     the mid right coronary artery with reduction of 100% narrowing to 0% with     placement of a 3.5 x 28 mm Penta stent preceded by a 3.5 x 13 mm Penta     stent. 2.  Successful primary stenting of the mid AV circumflex with reduction of     sequential 90% and 70% hazy areas to 0% with placement of a 3.0 x 18 mm     Penta stent.  ASSESSMENT AND PLAN:  Mr. Ruhland is a 70 year old gentleman who presents late status post inferior wall myocardial infarction.  He has severe two-vessel coronary artery disease with moderate disease in the left main and left anterior descending artery.  He also has moderate left ventricular dysfunction at this point.  The patient will be stabilized medically and aggressive attention paid towards risk factor modification. DD:  04/07/00 TD:  04/07/00 Job: [id number]

## 2010-10-12 NOTE — Cardiovascular Report (Signed)
[place]. [facility]  Patient:    Darin [name], Darin [name]                     MRN: [id number] Proc. Date: 04/08/00 Adm. Date:  [id number] Disc. Date: [id number] Attending:  Pricilla Riffle CC:         Dietrich Pates, M.D. [facility]  [facility]   Cardiac Catheterization  PROCEDURES PERFORMED: 1. Selective coronary angiography. 2. PTCA and stenting of the right coronary artery. 3. Intravascular ultrasound of the RCA. 4. Intra-aortic balloon pump.  DIAGNOSES: 1. Severe diffuse coronary artery disease. 2. Status post inferior wall myocardial infarction. 3. Acute reocclusion.  HISTORY:  The patient is a 70 year old gentleman who presented on April 07, 2000, with acute inferior wall myocardial infarction.  The patient was found to have occlusion of the right coronary artery requiring placement of two stents in the mid RCA, as well as severe narrowing of 90% in the mid A-V circumflex with haziness with placement of a third stent in this vessel.  The patient did well post-procedure and was transferred to the CCU.  Unfortunately over the night, he had recurrence of chest discomfort and ST elevation in the inferior lead suggesting acute reocclusion.  He is brought to the catheterization lab urgently for a relook angiography.  DESCRIPTION OF PROCEDURE:  After informed consent was obtained, the patient was brought to the catheterization lab where both groins were sterilely prepped and draped.  One percent lidocaine was used to infiltrate the left groin and a 7-French sheath placed in the left femoral artery using modified Seldinger technique.  A 6-French JL4 and JR4 catheter was then used to engage the left and right coronary arteries.  Selective angiography was performed in various projections using manual injections of contrast.  This showed patency of the left coronary artery with only moderate residual disease in the proximal and mid circumflex around the  area of stenting.  The stent in the left circumflex artery was widely patent.  The right coronary artery was occluded in its proximal segment.  We then proceeded with percutaneous intervention of the right coronary artery using a 7-French JL4 guide catheter. Heparin was administered intravenously and ReoPro infusion continued.  A 0.014 inch Luge wire was advanced within a 3.0 x 20 mm Maverick balloon.  This was carefully advanced in the distal section of the RCA, and the balloon was seen to flow through the RCA.  However, there is no antegrade flow.  Two inflations were performed within the mid RCA at 6 atmospheres for 30 seconds. This was successful in reestablishing flow.  However, there is evidence of severe diffuse disease in the distal RCA, as well as the posterior descending artery that had been noted on previous angiograms.  A 2.5 x 30 mm Maverick balloon was introduced and used to dilate the distal RCA in the posterior ventricular branch at 6 atmospheres for 60 seconds.  Three additional inflations were performed near the takeoff of the PDA into the mid RCA at   10 atmospheres for 60 seconds.  Repeat angiography showed improved vessel lumen in the distal RCA, after the posterior descending artery.  However, there was severe residual disease prior to the PDA.   A 3.0 x 30 mm Maverick balloon was introduced.  Two inflations were performed in the mid and distal RCA near a previous stenting at 12 atmospheres for 60 and 120 seconds, and a single inflation performed in the proximal RCA at  4 atmospheres for          45 seconds.  Repeat angiography showed residual narrowing in the mid RCA at the distal segment of the stented area consistent with an edge dissection.  There was persistent severe diffuse narrowings distally.  Intravascular ultrasound was then performed of the right coronary artery to further assess this area, and the distal RCA was found to be a large vessel of 3.0 mm and was  severely diseased.  The mid RCA was a large vessel of 3.5 mm to 4 mm, and had mild residual disease in the mid section in the area of stenting.  We elected to cover the distal edge dissection with a 3.0 x 28 mm Penta stent.  This was deployed at 12 atmospheres for 30 seconds.  With stent delivery, the balloon was then brought back and two additional inflations performed in the mid RCA at 12 atmospheres for 30 seconds and 12 atmospheres for 60 seconds. Repeat angiography now showed an excellent result with no residual disease in the mid RCA in the areas of stenting.  There was moderate residual disease in the distal RCA prior to the take-off of the PDA of approximately 50%.  The distal RCA after the PDA showed significant improvement in vessel lumen.  The posterior descending artery was severely diseased throughout its course, and this was felt due to spasm, intracoronary nitroglycerin, as well as verapamil were given with no significant improvement in the vessel lumen.  However, at this point, the patient had resolution of ST elevation as well as chest pain. We elected to place an intra-aortic balloon pump in the descending aorta. This was seen to function well under cineangiography, and was augmenting   1:1 with systolic pressure of greater than 100.  Heart rate was 60.  The sheath and balloon pump were then secured into position, and the patient transferred to the CCU in stable condition.  FINAL RESULT:  Successful PTCA and stenting of the mid right coronary artery with reduction of 100% InStent thrombus to 0%, and reduction of 90% mid right coronary artery edge dissection to less than 10% with placement of a 3.0 x 28 mm Penta stent.  ASSESSMENT AND PLAN:  The patient is a 70 year old gentleman with severe diffuse disease in his right coronary artery.  At this point, we will treat his coronary disease aggressively with intra-aortic balloon, ReoPro, heparin, Plavix, and aspirin.  Will  also continue the nitroglycerin drip and hopefully  achieve stability in the next 24 hours.  Consideration may be given towards surgical bypass.  However, the posterior descending artery at this point appears to be a diminutive vessel. DD:  04/08/00 TD:  04/08/00 Job: [id number]

## 2010-10-12 NOTE — Assessment & Plan Note (Signed)
[facility]                              CARDIOLOGY OFFICE NOTE   NAME:Darin [name], Darin [name]                     MRN:          [id number]  DATE:01/07/2006                            DOB:          January 08, 1941    Primary care/referring physician is Tillman Abide.   REASON FOR CONSULTATION:  Patient referred by Dr. Alphonsus Sias to reestablish  cardiovascular care.   HISTORY OF PRESENT ILLNESS:  Mr. Darin [name] is a 70 year old gentleman who  suffered inferior myocardial infarction in 2001.  He underwent stenting of  his right coronary artery using bare-metal stent by Dr. Chales Abrahams.  He was then  followed by Dr. Chales Abrahams until 2003.  He has not had any cardiac followup since  then.  Mr. Darin [name] has generally done well.  He denies any chest discomfort,  PND, orthopnea, edema, exertional dyspnea, palpitations and syncope.   [name] also has lower extremity peripheral arterial disease.  In 2002,  he had a right ABI of 0.45 and a left of 0.95.  His symptoms have been  fairly minimal and stable.  He says he is able to walk several miles with  minimal if any discomfort as long as he walks at his usual pace.  If,  however, he tries to hurry he does develop some discomfort in his right  calf.  He has never had any rest pain or tissue loss.  He finds that this  level of symptomatology is very minimally lifestyle limiting.   PAST MEDICAL HISTORY:  Remarkable for atherosclerotic coronary artery  disease, atherosclerotic peripheral arterial disease and  hypercholesterolemia.  He suffered an inferior myocardial infarction in  2001.  He had an appendectomy in 1960 and had an inguinal hernia repair in  the distant past.   ALLERGIES:  CODEINE causes him to itch.   CURRENT MEDICATIONS:  Altace 10 mg per day, metoprolol 25 mg twice per day,  aspirin 325 mg per day, multivitamin.  Dr. Alphonsus Sias recently prescribed Zocor  40 mg per day which the patient has not begun taking.   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  He  enjoys fishing and yard work.  He lives with his wife.  He quit smoking in  1985.  He denies alcohol and illicit drug use.   FAMILY HISTORY:  His father died at 45 of lung cancer.  Mother is alive at  41 with hypertension.  His sole brother is alive with hypertension.  His two  children are alive and well.   REVIEW OF SYSTEMS:  Remarkable for glasses and partial dentures.  He has  some allergic rhinitis.   REVIEW OF SYSTEMS:  Otherwise negative in detail except as above.   PHYSICAL EXAMINATION:  GENERAL:  He is generally well appearing in no  distress.  VITAL SIGNS:  Heart rate 70, blood pressure 108/68 on the right and 110/70  on the left.  He is 5 feet 9 inches tall and weighs 203 pounds.  HEENT:  Normal.  SKIN:  Normal.  MUSCULOSKELETAL:  Normal.  He has no thyromegaly, jugular venous  distention  or lymphadenopathy.  RESPIRATORY:  Effort is normal.  LUNGS:  Clear to auscultation.  He has a nondisplaced point of maximal  cardiac impulse.  There is a regular rate and rhythm without murmur, rub or  gallop.  ABDOMEN:  Soft, nondistended and nontender.  There is no hepatosplenomegaly.  Bowel sounds are normal.  EXTREMITIES:  Warm without clubbing, cyanosis, edema or ulceration.  Carotid  pulses are 2+ bilaterally without bruit.  Femoral pulses are 2+ bilaterally  without bruit.   Laboratory studies performed January 03, 2006 are remarkable for a creatinine  of a 1.0, potassium 4.2, LDL 165, HDL 34, triglycerides 124 and ALT 19.   Electrocardiogram performed January 02, 2006 demonstrates normal sinus rhythm  with old inferior infarct and right bundle branch block.   IMPRESSION/RECOMMENDATIONS:  1. Coronary artery disease with prior inferior myocardial infarction.      Ejection fraction was 45-55% as of 2002.  The patient remains      asymptomatic without any angina or evidence of clinical heart failure.      I agree  with his current management per Dr. Alphonsus Sias.  We will continue      aspirin which could be reduced to 81 mg per day.  Continue beta-blocker      given his prior myocardial infarction and continue the Altace given the      mild systolic dysfunction demonstrated in 2002.  2. Hypercholesterolemia:  The patient has an LDL of 165 off statin      therapy.  I agree with the 40 mg of Zocor which Dr. Alphonsus Sias recently      prescribed.  Goal LDL is less than 70.  Once this goal is established I      would consider the addition of Niacin to raise his HDL.  3. Peripheral arterial disease:  The patient has minimally lifestyle-      limiting claudication on the right.  This appears to be due primarily      to superficial femoral arterial disease.  We will continue conservative      management.  I have recommended that he increase his walking program      substantially.  With this, I think he can even further minimize his      symptoms.  Additional exercise will also help with his low HDL.   DISPOSITION:  The patient will follow up with Dr. Alphonsus Sias who will continue  to manage his risk factors.  I will see him back in 18 months' time to  maintain contact.                                Salvadore Farber, MD    WED/MedQ  DD:  01/07/2006 DT:  01/08/2006  Job #:  [id number]

## 2010-11-26 ENCOUNTER — Other Ambulatory Visit: Payer: Self-pay | Admitting: Cardiology

## 2010-11-26 MED ORDER — METOPROLOL TARTRATE 25 MG PO TABS: 25.0000 mg | ORAL_TABLET | Freq: Two times a day (BID) | ORAL | Status: DC

## 2010-11-26 NOTE — Telephone Encounter (Signed)
Called requesting refill on Metoprolol. Sent to [facility]

## 2010-11-26 NOTE — Telephone Encounter (Signed)
Called in wanting a refill of Metoprolol 50mg  for her husband at [facility] [phone]. Please call back. I have pulled the chart.

## 2010-12-04 ENCOUNTER — Encounter: Payer: Self-pay | Admitting: Cardiology

## 2010-12-11 ENCOUNTER — Ambulatory Visit (INDEPENDENT_AMBULATORY_CARE_PROVIDER_SITE_OTHER): Payer: Medicare Other | Admitting: Cardiology

## 2010-12-11 ENCOUNTER — Encounter: Payer: Self-pay | Admitting: Cardiology

## 2010-12-11 DIAGNOSIS — I739 Peripheral vascular disease, unspecified: Secondary | ICD-10-CM

## 2010-12-11 DIAGNOSIS — E785 Hyperlipidemia, unspecified: Secondary | ICD-10-CM

## 2010-12-11 DIAGNOSIS — I2510 Atherosclerotic heart disease of native coronary artery without angina pectoris: Secondary | ICD-10-CM

## 2010-12-11 NOTE — Assessment & Plan Note (Signed)
Continue current walking program. Claudication symptoms have improved.

## 2010-12-11 NOTE — Progress Notes (Signed)
Darin [name] Date of Birth: 1941-01-19   History of Present Illness: Darin [name] is seen today for followup. He states he has been feeling very well from a cardiac standpoint. He denies any symptoms of chest pain or shortness of breath. He remains active working in his garden or mowing grass. He reports that his claudication symptoms in his right lower extremity have actually gotten a little bit better.  Current Outpatient Prescriptions on File Prior to Visit  Medication Sig Dispense Refill  . aspirin 325 MG tablet Take 325 mg by mouth daily.        Marland Kitchen levothyroxine (SYNTHROID, LEVOTHROID) 50 MCG tablet Take 50 mcg by mouth daily.        . Multiple Vitamin (MULTIVITAMIN) tablet Take 1 tablet by mouth daily.        . niacin (NIASPAN) 1000 MG CR tablet Take 1,000 mg by mouth at bedtime.        . ramipril (ALTACE) 10 MG tablet Take 10 mg by mouth daily.        . ANDROGEL PUMP 1.25 GM/ACT (1%) GEL       . metoprolol tartrate (LOPRESSOR) 25 MG tablet Take 1 tablet (25 mg total) by mouth 2 (two) times daily.  60 tablet  5  . rosuvastatin (CRESTOR) 5 MG tablet Take 5 mg by mouth every other day.          Allergies  Allergen Reactions  . Atorvastatin     REACTION: Aching  . Codeine Sulfate     Past Medical History  Diagnosis Date  . Acute MI, inferior wall 2001    TREATED WITH DIRECT STENTING OF THE RIGHT CORONARY. HE HAS 3 BARE-METAL STENTS IN THE RIGHT CORONARY AND ONE IN THE LEFT CIRCUMFLEX CORONARY  . PAD (peripheral artery disease)   . Claudication     CHRONIC RIGHT LEG  . Hyperlipidemia   . Hypothyroidism   . Coronary artery disease   . History of polymyalgia rheumatica     Past Surgical History  Procedure Date  . Cardiac catheterization 04/08/2000    EF 30%. LV FUNCTION MODERATELY IMPAIRED. THERE IS AKINESIS OF THE INFERIOR WALL.  Marland Kitchen Appendectomy   . Hernia repair   . Coronary stent placement   . Cardiolite study 11/2008    SHOWED FIXED INFERIOR WALL DEFECT WITH A  NORMAL EF 53%  . Coronary angioplasty with stent placement     History  Smoking status  . Former Smoker  . Quit date: 05/27/1998  Smokeless tobacco  . Not on file    History  Alcohol Use No    Family History  Problem Relation Age of Onset  . Hypertension Mother   . Lung cancer Father   . Kidney failure Father   . Hypertension Brother     Review of Systems: The review of systems is notable for history of polymyalgia rheumatica. He is no longer on steroids.  Since he has been on thyroid replacement he is feeling much better.All other systems were reviewed and are negative.  Physical Exam: BP 120/80  Pulse 64  Ht 5\' 8"  (1.727 m)  Wt 199 lb 12.8 oz (90.629 kg)  BMI 30.38 kg/m2 He is normocephalic, atraumatic. Pupils are equal round and reactive to light accommodation. Extraocular movements are full. Oropharynx is clear. Neck is supple no JVD, adenopathy, thyromegaly, or bruits. Lungs are clear. Cardiac exam reveals a regular rate and rhythm without gallop, murmur, or click. Abdomen is soft and nontender without  masses or bruits. Femoral and pedal pulses are 2+ and symmetric. Skin is warm and dry. He is alert and oriented x3. Cranial nerves II through XII are intact. LABORATORY DATA:   Assessment / Plan:

## 2010-12-11 NOTE — Assessment & Plan Note (Signed)
He is on appropriate therapy with a statin and niacin. He is scheduled for complete lab work with Dr. Alphonsus Sias later this summer. He has been intolerant to higher dose statins in the past.

## 2010-12-11 NOTE — Patient Instructions (Signed)
Continue your current medications.  Get your follow up lab work with Dr. Alphonsus Sias.  I will see you again in 6 months.

## 2010-12-11 NOTE — Assessment & Plan Note (Signed)
He remains asymptomatic. 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 There was no ischemia and ejection fraction was 53%. We will continue with his current medical therapy which includes metoprolol, Altace, and aspirin.

## 2010-12-26 ENCOUNTER — Other Ambulatory Visit: Payer: Self-pay | Admitting: *Deleted

## 2010-12-26 NOTE — Telephone Encounter (Signed)
?   Refill.  It looks like it has been some time since the patient has been seen in our office.

## 2010-12-28 MED ORDER — RAMIPRIL 10 MG PO TABS: 10.0000 mg | ORAL_TABLET | Freq: Every day | ORAL | Status: DC

## 2010-12-28 NOTE — Telephone Encounter (Signed)
Ok to give one month supply.  Dr. Alphonsus Sias can decide if he needs to be seen once he returns.

## 2011-01-31 ENCOUNTER — Other Ambulatory Visit: Payer: Self-pay

## 2011-01-31 MED ORDER — RAMIPRIL 10 MG PO TABS: 10.0000 mg | ORAL_TABLET | Freq: Every day | ORAL | Status: DC

## 2011-01-31 NOTE — Telephone Encounter (Signed)
[facility] faxed refill request Ramipril 10 mg #30 x 0 with note pt needs to call for appt.

## 2011-03-19 ENCOUNTER — Other Ambulatory Visit: Payer: Self-pay | Admitting: *Deleted

## 2011-03-19 MED ORDER — RAMIPRIL 10 MG PO TABS: 10.0000 mg | ORAL_TABLET | Freq: Every day | ORAL | Status: DC

## 2011-03-19 NOTE — Telephone Encounter (Signed)
Received faxed refill request from pharmacy. Rx sent to pharmacy electronically. 

## 2011-03-21 ENCOUNTER — Encounter: Payer: Self-pay | Admitting: Internal Medicine

## 2011-03-22 ENCOUNTER — Ambulatory Visit (INDEPENDENT_AMBULATORY_CARE_PROVIDER_SITE_OTHER): Payer: Medicare Other | Admitting: Internal Medicine

## 2011-03-22 ENCOUNTER — Encounter: Payer: Self-pay | Admitting: Internal Medicine

## 2011-03-22 DIAGNOSIS — I739 Peripheral vascular disease, unspecified: Secondary | ICD-10-CM

## 2011-03-22 DIAGNOSIS — I2510 Atherosclerotic heart disease of native coronary artery without angina pectoris: Secondary | ICD-10-CM

## 2011-03-22 DIAGNOSIS — Z125 Encounter for screening for malignant neoplasm of prostate: Secondary | ICD-10-CM

## 2011-03-22 DIAGNOSIS — M353 Polymyalgia rheumatica: Secondary | ICD-10-CM

## 2011-03-22 DIAGNOSIS — E785 Hyperlipidemia, unspecified: Secondary | ICD-10-CM

## 2011-03-22 DIAGNOSIS — E039 Hypothyroidism, unspecified: Secondary | ICD-10-CM

## 2011-03-22 LAB — LIPID PANEL
Cholesterol: 206 mg/dL — ABNORMAL HIGH (ref 0–200)
HDL: 38.3 mg/dL — ABNORMAL LOW (ref >39.00)
Total CHOL/HDL Ratio: 5
Triglycerides: 102 mg/dL (ref 0.0–149.0)
VLDL: 20.4 mg/dL (ref 0.0–40.0)

## 2011-03-22 LAB — CBC WITH DIFFERENTIAL/PLATELET
Basophils Absolute: 0.1 10*3/uL (ref 0.0–0.1)
Eosinophils Absolute: 0.4 10*3/uL (ref 0.0–0.7)
Lymphocytes Relative: 19.1 % (ref 12.0–46.0)
Lymphs Abs: 1.1 10*3/uL (ref 0.7–4.0)
MCHC: 34 g/dL (ref 30.0–36.0)
Monocytes Relative: 14.6 % — ABNORMAL HIGH (ref 3.0–12.0)
Platelets: 244 10*3/uL (ref 150.0–400.0)
RDW: 14.6 % (ref 11.5–14.6)

## 2011-03-22 LAB — BASIC METABOLIC PANEL
BUN: 17 mg/dL (ref 6–23)
Calcium: 9 mg/dL (ref 8.4–10.5)
GFR: 91.56 mL/min (ref >60.00)
Glucose, Bld: 89 mg/dL (ref 70–99)

## 2011-03-22 LAB — TSH: TSH: 0.53 u[IU]/mL (ref 0.35–5.50)

## 2011-03-22 LAB — HEPATIC FUNCTION PANEL
Albumin: 3.9 g/dL (ref 3.5–5.2)
Alkaline Phosphatase: 57 U/L (ref 39–117)
Bilirubin, Direct: 0.1 mg/dL (ref 0.0–0.3)

## 2011-03-22 MED ORDER — TESTOSTERONE 12.5 MG/ACT (1%) TD GEL
1.0000 | TRANSDERMAL | Status: DC
Start: 2011-03-22 — End: 2013-07-06

## 2011-03-22 MED ORDER — LEVOTHYROXINE SODIUM 125 MCG PO TABS: 125.0000 ug | ORAL_TABLET | Freq: Every day | ORAL | Status: DC

## 2011-03-22 NOTE — Assessment & Plan Note (Signed)
Thyroid palpable Will check labs

## 2011-03-22 NOTE — Assessment & Plan Note (Signed)
Left claudication Discussed pushing through pain as possible to support neovascularization

## 2011-03-22 NOTE — Patient Instructions (Signed)
Please try to do without the androgel. If you have fatigue or sexual problems without it, okay to continue

## 2011-03-22 NOTE — Assessment & Plan Note (Signed)
Has been quiet Discussed staying active

## 2011-03-22 NOTE — Progress Notes (Signed)
Subjective:    Patient ID: Darin [name], male    DOB: Nov 01, 1940, 70 y.o.   MRN: [id number]  HPI Doing well  Had been seeing Dr Dagoberto Ligas for thyroid Pains are gone and no hand swelling or aching since on replacement I will follow him for this  On androgel "for energy" Still sexually active--feels this helps that Discussed potential problems  No chest pain No SOB No set exercise---gardens and lawn work No edema No sig dizziness  Walking is limited due to PVD Has to stop or slow down then pain resolves  Current Outpatient Prescriptions on File Prior to Visit  Medication Sig Dispense Refill  . ANDROGEL PUMP 1.25 GM/ACT (1%) GEL       . aspirin 325 MG tablet Take 325 mg by mouth daily.        . metoprolol tartrate (LOPRESSOR) 25 MG tablet Take 1 tablet (25 mg total) by mouth 2 (two) times daily.  60 tablet  5  . Multiple Vitamin (MULTIVITAMIN) tablet Take 1 tablet by mouth daily.        . niacin (NIASPAN) 1000 MG CR tablet Take 1,000 mg by mouth at bedtime.        . ramipril (ALTACE) 10 MG tablet Take 1 tablet (10 mg total) by mouth daily.  30 tablet  0  . rosuvastatin (CRESTOR) 5 MG tablet Take 5 mg by mouth every other day.        . levothyroxine (SYNTHROID, LEVOTHROID) 50 MCG tablet Take 50 mcg by mouth daily.         Allergies  Allergen Reactions  . Atorvastatin     REACTION: Aching  . Codeine Sulfate     Past Medical History  Diagnosis Date  . Acute MI, inferior wall 2001    TREATED WITH DIRECT STENTING OF THE RIGHT CORONARY. HE HAS 3 BARE-METAL STENTS IN THE RIGHT CORONARY AND ONE IN THE LEFT CIRCUMFLEX CORONARY  . PAD (peripheral artery disease)   . Claudication     CHRONIC RIGHT LEG  . Hyperlipidemia   . Hypothyroidism   . Coronary artery disease   . History of polymyalgia rheumatica   . Allergy   . Arthritis     Past Surgical History  Procedure Date  . Cardiac catheterization 04/08/2000    EF 30%. LV FUNCTION MODERATELY IMPAIRED. THERE IS AKINESIS  OF THE INFERIOR WALL.  Marland Kitchen Appendectomy   . Hernia repair   . Coronary stent placement   . Cardiolite study 11/2008    SHOWED FIXED INFERIOR WALL DEFECT WITH A NORMAL EF 53%  . Coronary angioplasty with stent placement     Family History  Problem Relation Age of Onset  . Hypertension Mother   . Lung cancer Father   . Kidney failure Father   . Hypertension Brother     History   Social History  . Marital Status: Married    Spouse Name: N/A    Number of Children: 2  . Years of Education: N/A   Occupational History  . [employer]- retired    Social History Main Topics  . Smoking status: Former Smoker    Quit date: 05/27/1998  . Smokeless tobacco: Never Used  . Alcohol Use: No  . Drug Use: No  . Sexually Active: Not on file   Other Topics Concern  . Not on file   Social History Narrative  . No narrative on file   Review of Systems occ mild memory problems---no worrisome symptoms Sleep has  been a problem in the past year No mood problems Tried melatonin--no help    Objective:   Physical Exam  Constitutional: He appears well-developed and well-nourished. No distress.  Neck: Normal range of motion. Neck supple. Thyromegaly present.       Palpable mildly enlarged firm thyroid---more on left side No distinct nodule  Cardiovascular: Normal rate, regular rhythm and normal heart sounds.  Exam reveals no gallop.   No murmur heard.      Normal pulse left foot--not palpable on right  Pulmonary/Chest: Effort normal and breath sounds normal. No respiratory distress. He has no wheezes. He has no rales.  Abdominal: Soft. There is no tenderness.  Musculoskeletal: Normal range of motion. He exhibits no edema and no tenderness.  Lymphadenopathy:    He has no cervical adenopathy.  Skin: No rash noted.  Psychiatric: He has a normal mood and affect. His behavior is normal. Judgment and thought content normal.          Assessment & Plan:

## 2011-03-22 NOTE — Assessment & Plan Note (Signed)
Tolerating low dose crestor On niacin Due for labs

## 2011-05-09 ENCOUNTER — Other Ambulatory Visit: Payer: Self-pay | Admitting: *Deleted

## 2011-05-09 MED ORDER — RAMIPRIL 10 MG PO TABS: 10.0000 mg | ORAL_TABLET | Freq: Every day | ORAL | Status: DC

## 2011-05-09 NOTE — Telephone Encounter (Signed)
rx sent to pharmacy by e-script  

## 2011-10-07 ENCOUNTER — Other Ambulatory Visit: Payer: Self-pay | Admitting: *Deleted

## 2011-10-07 MED ORDER — LEVOTHYROXINE SODIUM 125 MCG PO TABS: 125.0000 ug | ORAL_TABLET | Freq: Every day | ORAL | Status: DC

## 2011-10-07 MED ORDER — NIACIN ER (ANTIHYPERLIPIDEMIC) 1000 MG PO TBCR: 1000.0000 mg | EXTENDED_RELEASE_TABLET | Freq: Every day | ORAL | Status: AC

## 2011-10-07 MED ORDER — RAMIPRIL 10 MG PO TABS: 10.0000 mg | ORAL_TABLET | Freq: Every day | ORAL | Status: DC

## 2011-10-07 MED ORDER — METOPROLOL TARTRATE 25 MG PO TABS: 25.0000 mg | ORAL_TABLET | Freq: Two times a day (BID) | ORAL | Status: DC

## 2011-10-07 MED ORDER — ROSUVASTATIN CALCIUM 5 MG PO TABS: 5.0000 mg | ORAL_TABLET | ORAL | Status: DC

## 2011-11-05 ENCOUNTER — Telehealth: Payer: Self-pay | Admitting: *Deleted

## 2011-11-05 NOTE — Telephone Encounter (Signed)
Received from [facility], request for clearance, paper filled out by Dr [place] and faxed in

## 2012-04-07 ENCOUNTER — Other Ambulatory Visit: Payer: Self-pay | Admitting: *Deleted

## 2012-04-07 MED ORDER — LEVOTHYROXINE SODIUM 125 MCG PO TABS: 125.0000 ug | ORAL_TABLET | Freq: Every day | ORAL | Status: DC

## 2012-04-30 ENCOUNTER — Other Ambulatory Visit: Payer: Self-pay

## 2012-04-30 MED ORDER — LEVOTHYROXINE SODIUM 125 MCG PO TABS: 125.0000 ug | ORAL_TABLET | Freq: Every day | ORAL | Status: DC

## 2012-04-30 NOTE — Telephone Encounter (Signed)
pts wife scheduled f/u 05/11/12 at 2:15. Pt does not have enough med until appt. Levothyroxine 125 mcg # 30 x 0 to [facility]. Pt's wife notified while on phone.

## 2012-05-11 ENCOUNTER — Encounter: Payer: Self-pay | Admitting: Internal Medicine

## 2012-05-11 ENCOUNTER — Ambulatory Visit (INDEPENDENT_AMBULATORY_CARE_PROVIDER_SITE_OTHER): Payer: Medicare Other | Admitting: Internal Medicine

## 2012-05-11 VITALS — BP 118/70 | HR 74 | Temp 97.8°F | Wt 216.0 lb

## 2012-05-11 DIAGNOSIS — I739 Peripheral vascular disease, unspecified: Secondary | ICD-10-CM

## 2012-05-11 DIAGNOSIS — I2510 Atherosclerotic heart disease of native coronary artery without angina pectoris: Secondary | ICD-10-CM

## 2012-05-11 DIAGNOSIS — E039 Hypothyroidism, unspecified: Secondary | ICD-10-CM

## 2012-05-11 DIAGNOSIS — E785 Hyperlipidemia, unspecified: Secondary | ICD-10-CM

## 2012-05-11 DIAGNOSIS — M353 Polymyalgia rheumatica: Secondary | ICD-10-CM

## 2012-05-11 DIAGNOSIS — N529 Male erectile dysfunction, unspecified: Secondary | ICD-10-CM

## 2012-05-11 LAB — HEPATIC FUNCTION PANEL
Albumin: 4 g/dL (ref 3.5–5.2)
Alkaline Phosphatase: 51 U/L (ref 39–117)
Bilirubin, Direct: 0 mg/dL (ref 0.0–0.3)
Total Protein: 7.2 g/dL (ref 6.0–8.3)

## 2012-05-11 LAB — CBC WITH DIFFERENTIAL/PLATELET
Basophils Absolute: 0.1 10*3/uL (ref 0.0–0.1)
Basophils Relative: 0.9 % (ref 0.0–3.0)
Eosinophils Absolute: 0.3 10*3/uL (ref 0.0–0.7)
Eosinophils Relative: 5.9 % — ABNORMAL HIGH (ref 0.0–5.0)
HCT: 42.4 % (ref 39.0–52.0)
Hemoglobin: 14.7 g/dL (ref 13.0–17.0)
Lymphocytes Relative: 23.2 % (ref 12.0–46.0)
Lymphs Abs: 1.3 10*3/uL (ref 0.7–4.0)
MCHC: 34.7 g/dL (ref 30.0–36.0)
MCV: 94.2 fl (ref 78.0–100.0)
Monocytes Absolute: 0.7 10*3/uL (ref 0.1–1.0)
Monocytes Relative: 11.4 % (ref 3.0–12.0)
Neutro Abs: 3.4 10*3/uL (ref 1.4–7.7)
Neutrophils Relative %: 58.6 % (ref 43.0–77.0)
Platelets: 282 10*3/uL (ref 150.0–400.0)
RBC: 4.5 Mil/uL (ref 4.22–5.81)
RDW: 14 % (ref 11.5–14.6)
WBC: 5.7 10*3/uL (ref 4.5–10.5)

## 2012-05-11 LAB — LIPID PANEL
Cholesterol: 208 mg/dL — ABNORMAL HIGH (ref 0–200)
HDL: 35 mg/dL — ABNORMAL LOW (ref >39.00)
Total CHOL/HDL Ratio: 6
Triglycerides: 179 mg/dL — ABNORMAL HIGH (ref 0.0–149.0)
VLDL: 35.8 mg/dL (ref 0.0–40.0)

## 2012-05-11 LAB — BASIC METABOLIC PANEL
BUN: 12 mg/dL (ref 6–23)
CO2: 26 mEq/L (ref 19–32)
Calcium: 8.9 mg/dL (ref 8.4–10.5)
Chloride: 103 mEq/L (ref 96–112)
Creatinine, Ser: 0.9 mg/dL (ref 0.4–1.5)
GFR: 87.78 mL/min (ref >60.00)
Glucose, Bld: 99 mg/dL (ref 70–99)
Potassium: 4.3 mEq/L (ref 3.5–5.1)
Sodium: 139 mEq/L (ref 135–145)

## 2012-05-11 LAB — TSH: TSH: 0.28 u[IU]/mL — ABNORMAL LOW (ref 0.35–5.50)

## 2012-05-11 LAB — T4, FREE: Free T4: 0.98 ng/dL (ref 0.60–1.60)

## 2012-05-11 LAB — SEDIMENTATION RATE: Sed Rate: 10 mm/hr (ref 0–22)

## 2012-05-11 MED ORDER — LEVOTHYROXINE SODIUM 125 MCG PO TABS: 125.0000 ug | ORAL_TABLET | Freq: Every day | ORAL | Status: DC

## 2012-05-11 NOTE — Assessment & Plan Note (Signed)
On niacin only Afraid of statins since lipitor reaction

## 2012-05-11 NOTE — Assessment & Plan Note (Signed)
No pain---but not really pushing it at all Continue the ASA Not willing to take statin

## 2012-05-11 NOTE — Assessment & Plan Note (Signed)
Seems to be clinically euthyroid Will recheck labs

## 2012-05-11 NOTE — Progress Notes (Signed)
Subjective:    Patient ID: Darin [name], male    DOB: March 26, 1941, 71 y.o.   MRN: [id number]  HPI Here for follow up Needs refills  Hasn't been taking the crestor Just not comfortable with this Does take the niaspan---discussed that statins have more evidence but he doesn't want this  No exercise He has heard of the silver sneakers  No chest pain No SOB No dizziness or syncope No edema  Still on the thyroid replacement No skin or hair problems  Current Outpatient Prescriptions on File Prior to Visit  Medication Sig Dispense Refill  . aspirin 325 MG tablet Take 325 mg by mouth daily.        Marland Kitchen levothyroxine (SYNTHROID, LEVOTHROID) 125 MCG tablet Take 1 tablet (125 mcg total) by mouth daily.  30 tablet  0  . metoprolol tartrate (LOPRESSOR) 25 MG tablet Take 1 tablet (25 mg total) by mouth 2 (two) times daily.  180 tablet  1  . Multiple Vitamin (MULTIVITAMIN) tablet Take 1 tablet by mouth daily.        . niacin (NIASPAN) 1000 MG CR tablet Take 1 tablet (1,000 mg total) by mouth at bedtime.  90 tablet  1  . ramipril (ALTACE) 10 MG tablet Take 1 tablet (10 mg total) by mouth daily.  30 tablet  11  . Testosterone (ANDROGEL PUMP) 1.25 GM/ACT (1%) GEL Apply 1 spray topically 1 day or 1 dose.  1 Bottle  5    Allergies  Allergen Reactions  . Atorvastatin     REACTION: Aching  . Codeine Sulfate     Past Medical History  Diagnosis Date  . Acute MI, inferior wall 2001    TREATED WITH DIRECT STENTING OF THE RIGHT CORONARY. HE HAS 3 BARE-METAL STENTS IN THE RIGHT CORONARY AND ONE IN THE LEFT CIRCUMFLEX CORONARY  . PAD (peripheral artery disease)   . Claudication     CHRONIC RIGHT LEG  . Hyperlipidemia   . Hypothyroidism   . Coronary artery disease   . History of polymyalgia rheumatica   . Allergy   . Arthritis     Past Surgical History  Procedure Date  . Cardiac catheterization 04/08/2000    EF 30%. LV FUNCTION MODERATELY IMPAIRED. THERE IS AKINESIS OF THE INFERIOR  WALL.  Marland Kitchen Appendectomy   . Hernia repair   . Coronary stent placement   . Cardiolite study 11/2008    SHOWED FIXED INFERIOR WALL DEFECT WITH A NORMAL EF 53%  . Coronary angioplasty with stent placement     Family History  Problem Relation Age of Onset  . Hypertension Mother   . Lung cancer Father   . Kidney failure Father   . Hypertension Brother     History   Social History  . Marital Status: Married    Spouse Name: N/A    Number of Children: 2  . Years of Education: N/A   Occupational History  . [employer]- retired    Social History Main Topics  . Smoking status: Former Smoker    Quit date: 05/27/1998  . Smokeless tobacco: Never Used  . Alcohol Use: No  . Drug Use: No  . Sexually Active: Not on file   Other Topics Concern  . Not on file   Social History Narrative  . No narrative on file   Review of Systems Has gained 10# since last visit Hasn't been using the testosterone Sleeping fine    Objective:   Physical Exam  Constitutional: He appears  well-developed and well-nourished. No distress.  Neck: Normal range of motion. Neck supple.  Cardiovascular: Normal rate, regular rhythm and normal heart sounds.  Exam reveals no gallop.   No murmur heard.      2+ pulse in left foot, not palpable on right  Pulmonary/Chest: Effort normal and breath sounds normal. No respiratory distress. He has no wheezes. He has no rales.  Musculoskeletal: He exhibits no edema and no tenderness.  Lymphadenopathy:    He has no cervical adenopathy.  Psychiatric: He has a normal mood and affect. His behavior is normal.          Assessment & Plan:

## 2012-05-11 NOTE — Assessment & Plan Note (Addendum)
Has been quiet No symptoms of CHF On appropriate Rx but won't take statin Discussed fitness---urged him to start silver Sneaker plan

## 2012-05-12 ENCOUNTER — Encounter: Payer: Self-pay | Admitting: *Deleted

## 2012-08-25 ENCOUNTER — Other Ambulatory Visit: Payer: Self-pay | Admitting: *Deleted

## 2012-08-25 MED ORDER — METOPROLOL TARTRATE 25 MG PO TABS: 25.0000 mg | ORAL_TABLET | Freq: Two times a day (BID) | ORAL | Status: DC

## 2012-08-25 NOTE — Telephone Encounter (Signed)
pts wife left v/m requesting refill metoprolol to [facility] [place]. Left v/m advising pt ck with [facility] for refill.

## 2012-08-26 ENCOUNTER — Other Ambulatory Visit: Payer: Self-pay | Admitting: *Deleted

## 2012-08-26 MED ORDER — RAMIPRIL 10 MG PO TABS: 10.0000 mg | ORAL_TABLET | Freq: Every day | ORAL | Status: DC

## 2012-08-26 NOTE — Telephone Encounter (Signed)
Rx sent to pharmacy by escript  

## 2013-01-07 ENCOUNTER — Encounter: Payer: Self-pay | Admitting: Cardiology

## 2013-06-02 ENCOUNTER — Other Ambulatory Visit: Payer: Self-pay | Admitting: Internal Medicine

## 2013-07-06 ENCOUNTER — Ambulatory Visit (INDEPENDENT_AMBULATORY_CARE_PROVIDER_SITE_OTHER): Payer: Medicare HMO | Admitting: Internal Medicine

## 2013-07-06 ENCOUNTER — Encounter: Payer: Self-pay | Admitting: Internal Medicine

## 2013-07-06 VITALS — BP 140/80 | HR 82 | Temp 98.4°F | Wt 214.0 lb

## 2013-07-06 DIAGNOSIS — I2510 Atherosclerotic heart disease of native coronary artery without angina pectoris: Secondary | ICD-10-CM

## 2013-07-06 DIAGNOSIS — E039 Hypothyroidism, unspecified: Secondary | ICD-10-CM

## 2013-07-06 DIAGNOSIS — Z1211 Encounter for screening for malignant neoplasm of colon: Secondary | ICD-10-CM

## 2013-07-06 DIAGNOSIS — I739 Peripheral vascular disease, unspecified: Secondary | ICD-10-CM

## 2013-07-06 DIAGNOSIS — E785 Hyperlipidemia, unspecified: Secondary | ICD-10-CM

## 2013-07-06 LAB — COMPREHENSIVE METABOLIC PANEL
ALT: 15 U/L (ref 0–53)
AST: 20 U/L (ref 0–37)
Albumin: 3.6 g/dL (ref 3.5–5.2)
Alkaline Phosphatase: 47 U/L (ref 39–117)
BILIRUBIN TOTAL: 0.6 mg/dL (ref 0.3–1.2)
BUN: 11 mg/dL (ref 6–23)
CO2: 26 mEq/L (ref 19–32)
Calcium: 8.7 mg/dL (ref 8.4–10.5)
Chloride: 108 mEq/L (ref 96–112)
Creatinine, Ser: 0.9 mg/dL (ref 0.4–1.5)
GFR: 92.15 mL/min (ref >60.00)
Glucose, Bld: 94 mg/dL (ref 70–99)
Potassium: 4.3 mEq/L (ref 3.5–5.1)
SODIUM: 139 meq/L (ref 135–145)
TOTAL PROTEIN: 6.5 g/dL (ref 6.0–8.3)

## 2013-07-06 LAB — CBC WITH DIFFERENTIAL/PLATELET
Basophils Absolute: 0 10*3/uL (ref 0.0–0.1)
Basophils Relative: 0.5 % (ref 0.0–3.0)
EOS PCT: 5.8 % — AB (ref 0.0–5.0)
Eosinophils Absolute: 0.3 10*3/uL (ref 0.0–0.7)
HCT: 42.8 % (ref 39.0–52.0)
Hemoglobin: 14 g/dL (ref 13.0–17.0)
Lymphocytes Relative: 19.2 % (ref 12.0–46.0)
Lymphs Abs: 1.1 10*3/uL (ref 0.7–4.0)
MCHC: 32.7 g/dL (ref 30.0–36.0)
MCV: 98.9 fl (ref 78.0–100.0)
Monocytes Absolute: 0.5 10*3/uL (ref 0.1–1.0)
Monocytes Relative: 9.5 % (ref 3.0–12.0)
Neutro Abs: 3.8 10*3/uL (ref 1.4–7.7)
Neutrophils Relative %: 65 % (ref 43.0–77.0)
Platelets: 294 10*3/uL (ref 150.0–400.0)
RBC: 4.33 Mil/uL (ref 4.22–5.81)
RDW: 14.1 % (ref 11.5–14.6)
WBC: 5.8 10*3/uL (ref 4.5–10.5)

## 2013-07-06 LAB — LIPID PANEL
Cholesterol: 206 mg/dL — ABNORMAL HIGH (ref 0–200)
HDL: 33.3 mg/dL — ABNORMAL LOW (ref >39.00)
Total CHOL/HDL Ratio: 6
Triglycerides: 179 mg/dL — ABNORMAL HIGH (ref 0.0–149.0)
VLDL: 35.8 mg/dL (ref 0.0–40.0)

## 2013-07-06 LAB — TSH: TSH: 0.84 u[IU]/mL (ref 0.35–5.50)

## 2013-07-06 LAB — T4, FREE: Free T4: 1.08 ng/dL (ref 0.60–1.60)

## 2013-07-06 LAB — LDL CHOLESTEROL, DIRECT: Direct LDL: 153.5 mg/dL

## 2013-07-06 NOTE — Progress Notes (Signed)
Subjective:    Patient ID: Darin [name], male    DOB: Oct 19, 1940, 73 y.o.   MRN: [id number]  HPI Here for follow up Doing well  No heart problems No chest pain No SOB No dizziness or syncope  Still no exercise-- does garden, mow, etc in season No leg pain--tends to move slowly in compensation Will have right leg pain if he hurries or carries a load  Still doesn't want a statin Takes the niaspan  Still on thyroid med No problems with this  Current Outpatient Prescriptions on File Prior to Visit  Medication Sig Dispense Refill  . aspirin 81 MG tablet Take 81 mg by mouth daily.      . metoprolol tartrate (LOPRESSOR) 25 MG tablet Take 1 tablet (25 mg total) by mouth 2 (two) times daily.  180 tablet  1  . Multiple Vitamin (MULTIVITAMIN) tablet Take 1 tablet by mouth daily.        . niacin (NIASPAN) 1000 MG CR tablet Take 1 tablet (1,000 mg total) by mouth at bedtime.  90 tablet  1  . ramipril (ALTACE) 10 MG tablet Take 1 tablet (10 mg total) by mouth daily.  90 tablet  2  . SYNTHROID 125 MCG tablet TAKE ONE (1) TABLET BY MOUTH EACH DAY  30 tablet  0   No current facility-administered medications on file prior to visit.    Allergies  Allergen Reactions  . Atorvastatin     REACTION: Aching  . Codeine Sulfate     Past Medical History  Diagnosis Date  . Acute MI, inferior wall 2001    TREATED WITH DIRECT STENTING OF THE RIGHT CORONARY. HE HAS 3 BARE-METAL STENTS IN THE RIGHT CORONARY AND ONE IN THE LEFT CIRCUMFLEX CORONARY  . PAD (peripheral artery disease)   . Claudication     CHRONIC RIGHT LEG  . Hyperlipidemia   . Hypothyroidism   . Coronary artery disease   . History of polymyalgia rheumatica   . Allergy   . Arthritis     Past Surgical History  Procedure Laterality Date  . Cardiac catheterization  04/08/2000    EF 30%. LV FUNCTION MODERATELY IMPAIRED. THERE IS AKINESIS OF THE INFERIOR WALL.  Marland Kitchen Appendectomy    . Hernia repair    . Coronary stent  placement    . Cardiolite study  11/2008    SHOWED FIXED INFERIOR WALL DEFECT WITH A NORMAL EF 53%  . Coronary angioplasty with stent placement      Family History  Problem Relation Age of Onset  . Hypertension Mother   . Lung cancer Father   . Kidney failure Father   . Hypertension Brother     History   Social History  . Marital Status: Married    Spouse Name: N/A    Number of Children: 2  . Years of Education: N/A   Occupational History  . [employer]- retired    Social History Main Topics  . Smoking status: Former Smoker    Quit date: 05/27/1998  . Smokeless tobacco: Never Used  . Alcohol Use: No  . Drug Use: No  . Sexual Activity: Not on file   Other Topics Concern  . Not on file   Social History Narrative   No living will   Wife should be health care POA   Would accept resuscitation attempts   No tube feeds if cognitively unaware   Review of Systems Appetite is good Weight down 2# from last year Off  testosterone--didn't help ED Sleeps well Voids okay    Objective:   Physical Exam  Constitutional: He is oriented to person, place, and time. He appears well-developed and well-nourished. No distress.  HENT:  Mouth/Throat: Oropharynx is clear and moist. No oropharyngeal exudate.  Neck: Normal range of motion. Neck supple. No thyromegaly present.  Cardiovascular: Normal rate, regular rhythm and normal heart sounds.  Exam reveals no gallop.   No murmur heard. Normal pulse in left foot, absent in right  Pulmonary/Chest: Effort normal and breath sounds normal. No respiratory distress. He has no wheezes. He has no rales.  Abdominal: Soft. There is no tenderness.  Musculoskeletal: He exhibits no edema and no tenderness.  Lymphadenopathy:    He has no cervical adenopathy.  Neurological: He is alert and oriented to person, place, and time.  Psychiatric: He has a normal mood and affect. His behavior is normal.          Assessment & Plan:

## 2013-07-06 NOTE — Assessment & Plan Note (Signed)
Seems euthyroid Due for labs 

## 2013-07-06 NOTE — Assessment & Plan Note (Signed)
On niaspan Won't try another statin

## 2013-07-06 NOTE — Patient Instructions (Signed)
Exercise to Lose Weight Exercise and a healthy diet may help you lose weight. Your doctor may suggest specific exercises. EXERCISE IDEAS AND TIPS  Choose low-cost things you enjoy doing, such as walking, bicycling, or exercising to workout videos.  Take stairs instead of the elevator.  Walk during your lunch break.  Park your car further away from work or school.  Go to a gym or an exercise class.  Start with 5 to 10 minutes of exercise each day. Build up to 30 minutes of exercise 4 to 6 days a week.  Wear shoes with good support and comfortable clothes.  Stretch before and after working out.  Work out until you breathe harder and your heart beats faster.  Drink extra water when you exercise.  Do not do so much that you hurt yourself, feel dizzy, or get very short of breath. Exercises that burn about 150 calories:  Running 1  miles in 15 minutes.  Playing volleyball for 45 to 60 minutes.  Washing and waxing a car for 45 to 60 minutes.  Playing touch football for 45 minutes.  Walking 1  miles in 35 minutes.  Pushing a stroller 1  miles in 30 minutes.  Playing basketball for 30 minutes.  Raking leaves for 30 minutes.  Bicycling 5 miles in 30 minutes.  Walking 2 miles in 30 minutes.  Dancing for 30 minutes.  Shoveling snow for 15 minutes.  Swimming laps for 20 minutes.  Walking up stairs for 15 minutes.  Bicycling 4 miles in 15 minutes.  Gardening for 30 to 45 minutes.  Jumping rope for 15 minutes.  Washing windows or floors for 45 to 60 minutes. Document Released: 06/15/2010 Document Revised: 08/05/2011 Document Reviewed: 06/15/2010 ExitCare Patient Information 2014 [employer] DASH Diet The DASH diet stands for "Dietary Approaches to Stop Hypertension." It is a healthy eating plan that has been shown to reduce high blood pressure (hypertension) in as little as 14 days, while also possibly providing other significant health benefits. These other  health benefits include reducing the risk of breast cancer after menopause and reducing the risk of type 2 diabetes, heart disease, colon cancer, and stroke. Health benefits also include weight loss and slowing kidney failure in patients with chronic kidney disease.  DIET GUIDELINES  Limit salt (sodium). Your diet should contain less than 1500 mg of sodium daily.  Limit refined or processed carbohydrates. Your diet should include mostly whole grains. Desserts and added sugars should be used sparingly.  Include small amounts of heart-healthy fats. These types of fats include nuts, oils, and tub margarine. Limit saturated and trans fats. These fats have been shown to be harmful in the body. CHOOSING FOODS  The following food groups are based on a 2000 calorie diet. See your Registered Dietitian for individual calorie needs. Grains and Grain Products (6 to 8 servings daily)  Eat More Often: Whole-wheat bread, brown rice, whole-grain or wheat pasta, quinoa, popcorn without added fat or salt (air popped).  Eat Less Often: White bread, white pasta, white rice, cornbread. Vegetables (4 to 5 servings daily)  Eat More Often: Fresh, frozen, and canned vegetables. Vegetables may be raw, steamed, roasted, or grilled with a minimal amount of fat.  Eat Less Often/Avoid: Creamed or fried vegetables. Vegetables in a cheese sauce. Fruit (4 to 5 servings daily)  Eat More Often: All fresh, canned (in natural juice), or frozen fruits. Dried fruits without added sugar. One hundred percent fruit juice ( cup [237 mL] daily).    Eat Less Often: Dried fruits with added sugar. Canned fruit in light or heavy syrup. Lean Meats, Fish, and Poultry (2 servings or less daily. One serving is 3 to 4 oz [85-114 g]).  Eat More Often: Ninety percent or leaner ground beef, tenderloin, sirloin. Round cuts of beef, chicken breast, turkey breast. All fish. Grill, bake, or broil your meat. Nothing should be fried.  Eat Less  Often/Avoid: Fatty cuts of meat, turkey, or chicken leg, thigh, or wing. Fried cuts of meat or fish. Dairy (2 to 3 servings)  Eat More Often: Low-fat or fat-free milk, low-fat plain or light yogurt, reduced-fat or part-skim cheese.  Eat Less Often/Avoid: Milk (whole, 2%).Whole milk yogurt. Full-fat cheeses. Nuts, Seeds, and Legumes (4 to 5 servings per week)  Eat More Often: All without added salt.  Eat Less Often/Avoid: Salted nuts and seeds, canned beans with added salt. Fats and Sweets (limited)  Eat More Often: Vegetable oils, tub margarines without trans fats, sugar-free gelatin. Mayonnaise and salad dressings.  Eat Less Often/Avoid: Coconut oils, palm oils, butter, stick margarine, cream, half and half, cookies, candy, pie. FOR MORE INFORMATION The Dash Diet Eating Plan: www.dashdiet.org Document Released: 05/02/2011 Document Revised: 08/05/2011 Document Reviewed: 05/02/2011 ExitCare Patient Information 2014 [employer].  

## 2013-07-06 NOTE — Assessment & Plan Note (Signed)
Discussed pushing his exercise

## 2013-07-06 NOTE — Assessment & Plan Note (Signed)
Has been quiet On appropriate meds but won't take statin

## 2013-07-06 NOTE — Progress Notes (Signed)
Pre-visit discussion using our clinic review tool. No additional management support is needed unless otherwise documented below in the visit note.  

## 2013-07-08 ENCOUNTER — Other Ambulatory Visit: Payer: Self-pay | Admitting: Internal Medicine

## 2013-07-09 ENCOUNTER — Encounter: Payer: Self-pay | Admitting: *Deleted

## 2013-08-20 ENCOUNTER — Other Ambulatory Visit (INDEPENDENT_AMBULATORY_CARE_PROVIDER_SITE_OTHER): Payer: Commercial Managed Care - HMO

## 2013-08-20 DIAGNOSIS — Z1211 Encounter for screening for malignant neoplasm of colon: Secondary | ICD-10-CM

## 2013-08-20 LAB — FECAL OCCULT BLOOD, IMMUNOCHEMICAL: FECAL OCCULT BLD: NEGATIVE

## 2013-08-23 ENCOUNTER — Other Ambulatory Visit: Payer: Self-pay | Admitting: Internal Medicine

## 2013-08-24 ENCOUNTER — Encounter: Payer: Self-pay | Admitting: *Deleted

## 2013-09-06 ENCOUNTER — Other Ambulatory Visit: Payer: Self-pay | Admitting: Internal Medicine

## 2014-06-25 ENCOUNTER — Other Ambulatory Visit: Payer: Self-pay | Admitting: Internal Medicine

## 2014-07-08 ENCOUNTER — Encounter: Payer: Medicare HMO | Admitting: Internal Medicine

## 2014-07-08 ENCOUNTER — Telehealth: Payer: Self-pay | Admitting: Internal Medicine

## 2014-07-08 NOTE — Telephone Encounter (Signed)
R/s appointment to 10/2015 spouse aware Also schedule ac appointment for 2/16

## 2014-07-08 NOTE — Telephone Encounter (Signed)
He needs the appt rescheduled --though not emergently Make sure he is willing to come in for this

## 2014-07-08 NOTE — Telephone Encounter (Signed)
Patient did not come for their scheduled appointment today for medicare cpx  Please let me know if the patient needs to be contacted immediately for follow up or if no follow up is necessary.   ° °

## 2014-07-12 ENCOUNTER — Encounter: Payer: Self-pay | Admitting: Internal Medicine

## 2014-07-12 ENCOUNTER — Ambulatory Visit (INDEPENDENT_AMBULATORY_CARE_PROVIDER_SITE_OTHER): Payer: Commercial Managed Care - HMO | Admitting: Internal Medicine

## 2014-07-12 VITALS — BP 138/80 | HR 55 | Temp 97.6°F | Resp 14 | Ht 68.0 in | Wt 200.2 lb

## 2014-07-12 DIAGNOSIS — E039 Hypothyroidism, unspecified: Secondary | ICD-10-CM | POA: Diagnosis not present

## 2014-07-12 DIAGNOSIS — E785 Hyperlipidemia, unspecified: Secondary | ICD-10-CM | POA: Diagnosis not present

## 2014-07-12 DIAGNOSIS — I2510 Atherosclerotic heart disease of native coronary artery without angina pectoris: Secondary | ICD-10-CM | POA: Diagnosis not present

## 2014-07-12 DIAGNOSIS — G479 Sleep disorder, unspecified: Secondary | ICD-10-CM

## 2014-07-12 DIAGNOSIS — I739 Peripheral vascular disease, unspecified: Secondary | ICD-10-CM | POA: Diagnosis not present

## 2014-07-12 LAB — CBC WITH DIFFERENTIAL/PLATELET
BASOS ABS: 0 10*3/uL (ref 0.0–0.1)
BASOS PCT: 0.9 % (ref 0.0–3.0)
EOS ABS: 0.3 10*3/uL (ref 0.0–0.7)
Eosinophils Relative: 5.5 % — ABNORMAL HIGH (ref 0.0–5.0)
HCT: 43.3 % (ref 39.0–52.0)
HEMOGLOBIN: 14.6 g/dL (ref 13.0–17.0)
LYMPHS PCT: 23.8 % (ref 12.0–46.0)
Lymphs Abs: 1.4 10*3/uL (ref 0.7–4.0)
MCHC: 33.7 g/dL (ref 30.0–36.0)
MCV: 99.9 fl (ref 78.0–100.0)
MONO ABS: 0.7 10*3/uL (ref 0.1–1.0)
Monocytes Relative: 11.2 % (ref 3.0–12.0)
Neutro Abs: 3.4 10*3/uL (ref 1.4–7.7)
Neutrophils Relative %: 58.6 % (ref 43.0–77.0)
Platelets: 255 10*3/uL (ref 150.0–400.0)
RBC: 4.33 Mil/uL (ref 4.22–5.81)
RDW: 14.9 % (ref 11.5–15.5)
WBC: 5.8 10*3/uL (ref 4.0–10.5)

## 2014-07-12 LAB — T4, FREE: FREE T4: 1.08 ng/dL (ref 0.60–1.60)

## 2014-07-12 LAB — COMPREHENSIVE METABOLIC PANEL
ALT: 14 U/L (ref 0–53)
AST: 18 U/L (ref 0–37)
Albumin: 3.9 g/dL (ref 3.5–5.2)
Alkaline Phosphatase: 46 U/L (ref 39–117)
BILIRUBIN TOTAL: 0.8 mg/dL (ref 0.2–1.2)
BUN: 12 mg/dL (ref 6–23)
CO2: 31 meq/L (ref 19–32)
Calcium: 9.3 mg/dL (ref 8.4–10.5)
Chloride: 106 mEq/L (ref 96–112)
Creatinine, Ser: 0.9 mg/dL (ref 0.40–1.50)
GFR: 88.35 mL/min (ref >60.00)
Glucose, Bld: 95 mg/dL (ref 70–99)
POTASSIUM: 4.7 meq/L (ref 3.5–5.1)
SODIUM: 140 meq/L (ref 135–145)
Total Protein: 6.6 g/dL (ref 6.0–8.3)

## 2014-07-12 LAB — LIPID PANEL
CHOL/HDL RATIO: 5
Cholesterol: 203 mg/dL — ABNORMAL HIGH (ref 0–200)
HDL: 38.2 mg/dL — ABNORMAL LOW (ref >39.00)
LDL Cholesterol: 139 mg/dL — ABNORMAL HIGH (ref 0–99)
NonHDL: 164.8
TRIGLYCERIDES: 127 mg/dL (ref 0.0–149.0)
VLDL: 25.4 mg/dL (ref 0.0–40.0)

## 2014-07-12 LAB — TSH: TSH: 0.13 u[IU]/mL — ABNORMAL LOW (ref 0.35–4.50)

## 2014-07-12 MED ORDER — SYNTHROID 125 MCG PO TABS: 125.0000 ug | ORAL_TABLET | Freq: Every day | ORAL | Status: AC

## 2014-07-12 NOTE — Assessment & Plan Note (Signed)
No pulses but no sig claudication --unless he rushes No intervention for now--he won't take statin

## 2014-07-12 NOTE — Addendum Note (Signed)
Addended by: Sydell AxonLAWS, REGINA C on: 07/12/2014 01:11 PM   Modules accepted: Orders

## 2014-07-12 NOTE — Progress Notes (Signed)
Pre visit review using our clinic review tool, if applicable. No additional management support is needed unless otherwise documented below in the visit note. 

## 2014-07-12 NOTE — Assessment & Plan Note (Signed)
No symptoms On appropriate meds 

## 2014-07-12 NOTE — Assessment & Plan Note (Signed)
Problems with statin Just on niacin

## 2014-07-12 NOTE — Assessment & Plan Note (Signed)
Seems euthyroid Due for labs 

## 2014-07-12 NOTE — Assessment & Plan Note (Signed)
Discussed sleep hygiene Change niacin to AM Okay low dose aleve PM instead of Rx

## 2014-07-12 NOTE — Progress Notes (Signed)
Subjective:    Patient ID: Darin [name], male    DOB: 1941/04/13, 74 y.o.   MRN: [id number]  HPI Here for follow up   Heart is fine No chest pain No SOB No dizziness or syncope  Continues on the thyroid med No fatigue No hair, skin or nail problems  Taking niacin at night Only thing he is willing to take for his cholesterol  Sleep problems for 3 months Trouble initiating--- but then also wakening every 2 hours or so No alcohol No food close to bedtime Has tried some OTC "natural" products. Help slightly Aleve PM helps fair Melatonin not helpful but he doesn't know the dosage No daytime somnolence--awakens refreshed. Occasionally naps--- very brief No apnea or snoring of note  Having pain in left hip and leg Reminds him of herniated disc he had on the right years ago  Current Outpatient Prescriptions on File Prior to Visit  Medication Sig Dispense Refill  . aspirin 81 MG tablet Take 81 mg by mouth daily.    . metoprolol tartrate (LOPRESSOR) 25 MG tablet TAKE ONE (1) TABLET BY MOUTH TWO (2) TIMES DAILY 180 tablet 3  . Multiple Vitamin (MULTIVITAMIN) tablet Take 1 tablet by mouth daily.      . niacin (NIASPAN) 1000 MG CR tablet Take 1 tablet (1,000 mg total) by mouth at bedtime. 90 tablet 1  . ramipril (ALTACE) 10 MG capsule TAKE ONE CAPSULE BY MOUTH DAILY 90 capsule 3  . SYNTHROID 125 MCG tablet TAKE 1 TABLET BY MOUTH DAILY 30 tablet 0   No current facility-administered medications on file prior to visit.    Allergies  Allergen Reactions  . Atorvastatin     REACTION: Aching  . Codeine Sulfate     Past Medical History  Diagnosis Date  . Acute MI, inferior wall 2001    TREATED WITH DIRECT STENTING OF THE RIGHT CORONARY. HE HAS 3 BARE-METAL STENTS IN THE RIGHT CORONARY AND ONE IN THE LEFT CIRCUMFLEX CORONARY  . PAD (peripheral artery disease)   . Claudication     CHRONIC RIGHT LEG  . Hyperlipidemia   . Hypothyroidism   . Coronary artery disease   .  History of polymyalgia rheumatica   . Allergy   . Arthritis     Past Surgical History  Procedure Laterality Date  . Cardiac catheterization  04/08/2000    EF 30%. LV FUNCTION MODERATELY IMPAIRED. THERE IS AKINESIS OF THE INFERIOR WALL.  Marland Kitchen Appendectomy    . Hernia repair    . Coronary stent placement    . Cardiolite study  11/2008    SHOWED FIXED INFERIOR WALL DEFECT WITH A NORMAL EF 53%  . Coronary angioplasty with stent placement      Family History  Problem Relation Age of Onset  . Hypertension Mother   . Lung cancer Father   . Kidney failure Father   . Hypertension Brother     History   Social History  . Marital Status: Married    Spouse Name: N/A  . Number of Children: 2  . Years of Education: N/A   Occupational History  . [employer]- retired    Social History Main Topics  . Smoking status: Former Smoker    Quit date: 05/27/1998  . Smokeless tobacco: Never Used  . Alcohol Use: No  . Drug Use: No  . Sexual Activity: Not on file   Other Topics Concern  . Not on file   Social History Narrative   No  living will   Wife should be health care POA   Would accept resuscitation attempts   No tube feeds if cognitively unaware   Review of Systems Appetite is good Trying to be careful so lost 14#---congrats    Objective:   Physical Exam  Constitutional: He appears well-nourished. No distress.  Neck: Normal range of motion. Neck supple. No thyromegaly present.  Cardiovascular: Normal rate, regular rhythm and normal heart sounds.  Exam reveals no gallop.   No murmur heard. Feet warm but without palpable pulses  Pulmonary/Chest: Effort normal and breath sounds normal. No respiratory distress. He has no wheezes. He has no rales.  Abdominal: Soft. There is no tenderness.  Musculoskeletal: He exhibits no edema or tenderness.  Normal ROM left hip. SLR recreates his pain close to 90 degrees  Lymphadenopathy:    He has no cervical adenopathy.  Psychiatric: He has a  normal mood and affect. His behavior is normal.          Assessment & Plan:

## 2014-07-12 NOTE — Patient Instructions (Signed)
You can try melatonin again if you didn't try doses as high as 5-10mg  a day. If this doesn't work, you can stick with the 1 aleve PM for now. Try regular walking and core strengthening for sciatic pain.

## 2014-07-13 ENCOUNTER — Encounter: Payer: Self-pay | Admitting: *Deleted

## 2014-09-27 ENCOUNTER — Other Ambulatory Visit: Payer: Self-pay | Admitting: Internal Medicine

## 2014-11-14 ENCOUNTER — Encounter: Payer: Self-pay | Admitting: Family Medicine

## 2014-11-14 ENCOUNTER — Ambulatory Visit (INDEPENDENT_AMBULATORY_CARE_PROVIDER_SITE_OTHER)
Admission: RE | Admit: 2014-11-14 | Discharge: 2014-11-14 | Disposition: A | Discharge status: Home / Self Care | Payer: Commercial Managed Care - HMO | Source: Ambulatory Visit | Attending: Family Medicine | Admitting: Family Medicine

## 2014-11-14 ENCOUNTER — Ambulatory Visit (INDEPENDENT_AMBULATORY_CARE_PROVIDER_SITE_OTHER): Payer: Commercial Managed Care - HMO | Admitting: Family Medicine

## 2014-11-14 VITALS — BP 130/72 | HR 73 | Ht 68.0 in | Wt 196.0 lb

## 2014-11-14 DIAGNOSIS — M5416 Radiculopathy, lumbar region: Secondary | ICD-10-CM | POA: Diagnosis not present

## 2014-11-14 DIAGNOSIS — M79605 Pain in left leg: Secondary | ICD-10-CM

## 2014-11-14 MED ORDER — GABAPENTIN 100 MG PO CAPS: 100.0000 mg | ORAL_CAPSULE | Freq: Every day | ORAL | Status: DC

## 2014-11-14 MED ORDER — KETOROLAC TROMETHAMINE 60 MG/2ML IM SOLN
60.0000 mg | Freq: Once | INTRAMUSCULAR | Status: AC
  Administered 2014-11-14: 60 mg via INTRAMUSCULAR

## 2014-11-14 MED ORDER — TRAMADOL HCL 50 MG PO TABS: 50.0000 mg | ORAL_TABLET | Freq: Three times a day (TID) | ORAL | Status: DC | PRN

## 2014-11-14 MED ORDER — METHYLPREDNISOLONE ACETATE 80 MG/ML IJ SUSP
80.0000 mg | Freq: Once | INTRAMUSCULAR | Status: AC
  Administered 2014-11-14: 80 mg via INTRAMUSCULAR

## 2014-11-14 NOTE — Patient Instructions (Addendum)
Good to see you.  Ice 20 minutes 2 times daily. Usually after activity and before bed. 2 injections today Duexis 3 times daily for next 6 days.  Gabapentin at night Tramadol up to 2 times daily as needed for severe pain, can take it with a tylenol and it would help.  We are going to take this slow Xrays downstairs today See me again 7-10 days and we will make sure you are better

## 2014-11-14 NOTE — Progress Notes (Signed)
[facility] [address] [place], [place] [address] Phone: [phone] Subjective:    I'm seeing this patient by the request  of:  Tillman Abide, MD   CC: hip and leg pain.   GYF:[id number] Darin [name] is a 74 y.o. male coming in with complaint ofhip and leg pain. Patient states starting this week and patient started having pain. States that it seemed to start on the left side of his back and start draining down the leg. Patient does give a past medical history seemed to disc on the underside greater than 5 years responding well to oral scan injection. Patient states that this unfortunately feels very similar. Rates the severity of pain a 8 out of 10. States that it is unrelenting and keeping him up at night. Denies any weakness but states that there is an unsteady feeling on his leg. Denies any true injury. States that has been comfortable at night.      Past Medical History  Diagnosis Date  . Acute MI, inferior wall 2001    TREATED WITH DIRECT STENTING OF THE RIGHT CORONARY. HE HAS 3 BARE-METAL STENTS IN THE RIGHT CORONARY AND ONE IN THE LEFT CIRCUMFLEX CORONARY  . PAD (peripheral artery disease)   . Claudication     CHRONIC RIGHT LEG  . Hyperlipidemia   . Hypothyroidism   . Coronary artery disease   . History of polymyalgia rheumatica   . Allergy   . Arthritis    Past Surgical History  Procedure Laterality Date  . Cardiac catheterization  04/08/2000    EF 30%. LV FUNCTION MODERATELY IMPAIRED. THERE IS AKINESIS OF THE INFERIOR WALL.  Marland Kitchen Appendectomy    . Hernia repair    . Coronary stent placement    . Cardiolite study  11/2008    SHOWED FIXED INFERIOR WALL DEFECT WITH A NORMAL EF 53%  . Coronary angioplasty with stent placement     History  Substance Use Topics  . Smoking status: Former Smoker    Quit date: 05/27/1998  . Smokeless tobacco: Never Used  . Alcohol Use: No   Family History  Problem Relation Age of Onset  . Hypertension  Mother   . Lung cancer Father   . Kidney failure Father   . Hypertension Brother    Allergies  Allergen Reactions  . Atorvastatin     REACTION: Aching  . Codeine Sulfate      Past medical history, social, surgical and family history all reviewed in electronic medical record.   Review of Systems: No headache, visual changes, nausea, vomiting, diarrhea, constipation, dizziness, abdominal pain, skin rash, fevers, chills, night sweats, weight loss, swollen lymph nodes, body aches, joint swelling, muscle aches, chest pain, shortness of breath, mood changes.   Objective Blood pressure 130/72, pulse 73, height 5\' 8"  (1.727 m), weight 196 lb (88.905 kg), SpO2 96 %.  General: No apparent distress alert and oriented x3 mood and affect normal, dressed appropriately.  HEENT: Pupils equal, extraocular movements intact  Respiratory: Patient's speak in full sentences and does not appear short of breath  Cardiovascular: No lower extremity edema, non tender, no erythema  Skin: Warm dry intact with no signs of infection or rash on extremities or on axial skeleton.  Abdomen: Soft nontender  Neuro: Cranial nerves II through XII are intact, neurovascularly intact in all extremities with 2+ DTRs and 2+ pulses.  Lymph: No lymphadenopathy of posterior or anterior cervical chain or axillae bilaterally.  Gait antalgic gait.  MSK:  Non tender with full range of motion and good stability and symmetric strength and tone of shoulders, elbows, wrist, hip, knee and ankles bilaterally.  Back Exam:  Inspection: Unremarkable  Motion: Flexion 25 deg radicular symptoms down the left leg Extension 25 deg, Side Bending to 35 deg bilaterally,  Rotation to 43 deg bilaterally  SLR laying: positive lef tL5 dystrophy patient XSLR laying: Negative  Palpable tenderness: or tenderness of the paraspinal musculature L5-S1 FABER: negative. Sensory change: Gross sensation intact to all lumbar and sacral dermatomes.  Reflexes: 2+  at both patellar tendons, 2+ at achilles tendons, Babinski's downgoing.  Strength at foot  Plantar-flexion: 5/5 Dorsi-flexion: 5/5 Eversion: 5/5 Inversion: 5/5  Leg strength  Quad: 5/5 Hamstring: 5/5 Hip flexor: 5/5 Hip abductors: 5/5      Impression and Recommendations:     This case required medical decision making of moderate complexity.

## 2014-11-14 NOTE — Assessment & Plan Note (Signed)
Patient at this time is having. Patient was given to him today to see if this will be beneficial as well as we'll start his low dose of gabapentin at night. We discussed the possibility of prednisone and instead patient given anti-inflammatory a 6 day burst. Patient has tramadol for breakthrough pain. We discussed icing regimen as well as home exercises. he'll see me again in 1 week for further evaluation. If worsening symptoms or weakness patient will seek medical attention immediately.

## 2014-11-14 NOTE — Progress Notes (Signed)
Pre visit review using our clinic review tool, if applicable. No additional management support is needed unless otherwise documented below in the visit note. 

## 2014-11-21 ENCOUNTER — Encounter: Payer: Medicare HMO | Admitting: Internal Medicine

## 2014-11-22 ENCOUNTER — Encounter: Payer: Self-pay | Admitting: Family Medicine

## 2014-11-22 ENCOUNTER — Ambulatory Visit (INDEPENDENT_AMBULATORY_CARE_PROVIDER_SITE_OTHER): Payer: Commercial Managed Care - HMO | Admitting: Family Medicine

## 2014-11-22 VITALS — BP 118/68 | HR 68 | Ht 68.0 in | Wt 196.0 lb

## 2014-11-22 DIAGNOSIS — M5416 Radiculopathy, lumbar region: Secondary | ICD-10-CM | POA: Diagnosis not present

## 2014-11-22 MED ORDER — GABAPENTIN 300 MG PO CAPS: 300.0000 mg | ORAL_CAPSULE | Freq: Every day | ORAL | Status: AC

## 2014-11-22 NOTE — Assessment & Plan Note (Signed)
Discussed with patient at great length. I do believe the patient is having more of a lumbar radiculopathy. Patient still has been positive straight leg test. Differential includes piriformis but I think is highly likely with patient having worsening pain with the flexion. Patient also does not seem to make any significant improvement with increasing activity. Patient does have tramadol for breakthrough pain and we've increased his gabapentin to 300 mg at night. Patient will have an MRI for further evaluation for any herniated disc that would be causing an L5 nerve root compression. If this is shown we may need to consider an epidural steroidal injection. We will call patient with results. After the epidural if this is warranted rule out like to see in 1-2 days post injection.  Spent  25 minutes with patient face-to-face and had greater than 50% of counseling including as described above in assessment and plan.

## 2014-11-22 NOTE — Progress Notes (Signed)
Pre visit review using our clinic review tool, if applicable. No additional management support is needed unless otherwise documented below in the visit note. 

## 2014-11-22 NOTE — Patient Instructions (Signed)
Good to see you MRi of your back they will call you on MRI Gabapentin to  at night OK to take the meloxicam still and use the tramadol for breakthrough pain I will call you with the results and we will consider epidural.

## 2014-11-22 NOTE — Progress Notes (Signed)
Zach Arsal Tappan D.O. [facility] [address] [place], [place] [id number] Phone: [phone] Subjective:    I'm seeing this patient by the request  of:  Tillman Abideichard Letvak, MD   CC: hip and leg pain follow up  UVO:[id number]:Subjective Levin ErpWilliam A [name] is a 74 y.o. male coming in with complaint ofhip and leg pain. Patient was found to have more of a lumbar radiculopathy. Patient did have x-rays and show moderate osteoarthritic changes of the back at multiple levels. Patient was put on gabapentin as well as anti-inflammatory's. Patient was given some mild home exercises and icing protocol. Patient states he is not made any significant improvement. If anything the numbness that he was having down the leg seems to be more chronic. Seems to be on the lateral aspect the ankle and going down the leg. Patient states his lungs he stands up he seems to be doing well but if he actually bends over or sits for long amount of time he has more pain. Patient declined any prednisone previously. Patient states that this is affecting all daily activities and making it difficult to sleep.     Past Medical History  Diagnosis Date  . Acute MI, inferior wall 2001    TREATED WITH DIRECT STENTING OF THE RIGHT CORONARY. HE HAS 3 BARE-METAL STENTS IN THE RIGHT CORONARY AND ONE IN THE LEFT CIRCUMFLEX CORONARY  . PAD (peripheral artery disease)   . Claudication     CHRONIC RIGHT LEG  . Hyperlipidemia   . Hypothyroidism   . Coronary artery disease   . History of polymyalgia rheumatica   . Allergy   . Arthritis    Past Surgical History  Procedure Laterality Date  . Cardiac catheterization  04/08/2000    EF 30%. LV FUNCTION MODERATELY IMPAIRED. THERE IS AKINESIS OF THE INFERIOR WALL.  Marland Kitchen. Appendectomy    . Hernia repair    . Coronary stent placement    . Cardiolite study  11/2008    SHOWED FIXED INFERIOR WALL DEFECT WITH A NORMAL EF 53%  . Coronary angioplasty with stent placement     History  Substance Use Topics    . Smoking status: Former Smoker    Quit date: 05/27/1998  . Smokeless tobacco: Never Used  . Alcohol Use: No   Family History  Problem Relation Age of Onset  . Hypertension Mother   . Lung cancer Father   . Kidney failure Father   . Hypertension Brother    Allergies  Allergen Reactions  . Atorvastatin     REACTION: Aching  . Codeine Sulfate      Past medical history, social, surgical and family history all reviewed in electronic medical record.   Review of Systems: No headache, visual changes, nausea, vomiting, diarrhea, constipation, dizziness, abdominal pain, skin rash, fevers, chills, night sweats, weight loss, swollen lymph nodes, body aches, joint swelling, muscle aches, chest pain, shortness of breath, mood changes.   Objective Blood pressure 118/68, pulse 68, height 5\' 8"  (1.727 m), weight 196 lb (88.905 kg), SpO2 97 %.  General: No apparent distress alert and oriented x3 mood and affect normal, dressed appropriately.  HEENT: Pupils equal, extraocular movements intact  Respiratory: Patient's speak in full sentences and does not appear short of breath  Cardiovascular: No lower extremity edema, non tender, no erythema  Skin: Warm dry intact with no signs of infection or rash on extremities or on axial skeleton.  Abdomen: Soft nontender  Neuro: Cranial nerves II through XII are intact,  neurovascularly intact in all extremities with 2+ DTRs and 2+ pulses.  Lymph: No lymphadenopathy of posterior or anterior cervical chain or axillae bilaterally.  Gait antalgic gait.  MSK:  Non tender with full range of motion and good stability and symmetric strength and tone of shoulders, elbows, wrist, hip, knee and ankles bilaterally.  Back Exam:  Inspection: Unremarkable  Motion: Flexion 25 deg radicular symptoms down the left leg Extension 25 deg, Side Bending to 35 deg bilaterally,  Rotation to 43 deg bilaterally  SLR laying: Positive left L5 distribution XSLR laying: Negative   Palpable tenderness: or tenderness of the paraspinal musculature L5-S1 FABER: negative. Sensory change: Gross sensation intact to all lumbar and sacral dermatomes.  Reflexes: 2+ at both patellar tendons, 2+ at achilles tendons, Babinski's downgoing.  Strength at foot  Plantar-flexion: 4/5 Dorsi-flexion: 4/5 Eversion: 4/5 Inversion: 4/5  Leg strength  Quad: 5/5 Hamstring: 5/5 Hip flexor: 4/5 Hip abductors: 4/5  On left side compared to full strength on right side    Impression and Recommendations:     This case required medical decision making of moderate complexity.

## 2014-11-23 ENCOUNTER — Telehealth: Payer: Self-pay

## 2014-11-23 ENCOUNTER — Telehealth: Payer: Self-pay | Admitting: Family Medicine

## 2014-11-23 NOTE — Telephone Encounter (Signed)
Spoke with patient regarding who will do the injection that Dr. Katrinka BlazingSmith recommends once the MRI has been completed to determine exactly which level is appropriate to do the epidural in.

## 2014-11-23 NOTE — Telephone Encounter (Signed)
Patient would like to know who will give him a Cortizone shot.  If that will be Dr. Katrinka BlazingSmith or someone else.

## 2014-11-25 ENCOUNTER — Other Ambulatory Visit: Payer: Self-pay | Admitting: *Deleted

## 2014-11-25 ENCOUNTER — Ambulatory Visit
Admission: RE | Admit: 2014-11-25 | Discharge: 2014-11-25 | Disposition: A | Discharge status: Home / Self Care | Payer: Commercial Managed Care - HMO | Source: Ambulatory Visit | Attending: Family Medicine | Admitting: Family Medicine

## 2014-11-25 DIAGNOSIS — M5416 Radiculopathy, lumbar region: Secondary | ICD-10-CM

## 2014-11-30 ENCOUNTER — Telehealth: Payer: Self-pay | Admitting: Family Medicine

## 2014-11-30 MED ORDER — TRAMADOL HCL 50 MG PO TABS: 50.0000 mg | ORAL_TABLET | Freq: Three times a day (TID) | ORAL | Status: DC | PRN

## 2014-11-30 NOTE — Telephone Encounter (Signed)
Patient is needing a refill for traMADol (ULTRAM) 50 MG tablet [[id number]. Pharmacy is [facility] Please advise patient

## 2014-11-30 NOTE — Telephone Encounter (Signed)
Refill done.  

## 2014-12-02 ENCOUNTER — Other Ambulatory Visit: Payer: Self-pay | Admitting: Family Medicine

## 2014-12-02 ENCOUNTER — Ambulatory Visit
Admission: RE | Admit: 2014-12-02 | Discharge: 2014-12-02 | Disposition: A | Discharge status: Home / Self Care | Payer: Commercial Managed Care - HMO | Source: Ambulatory Visit | Attending: Family Medicine | Admitting: Family Medicine

## 2014-12-02 DIAGNOSIS — M5416 Radiculopathy, lumbar region: Secondary | ICD-10-CM

## 2014-12-02 MED ORDER — IOHEXOL 180 MG/ML  SOLN
1.0000 mL | Freq: Once | INTRAMUSCULAR | Status: AC | PRN
  Administered 2014-12-02: 1 mL via EPIDURAL

## 2014-12-02 MED ORDER — METHYLPREDNISOLONE ACETATE 40 MG/ML INJ SUSP (RADIOLOG
120.0000 mg | Freq: Once | INTRAMUSCULAR | Status: AC
  Administered 2014-12-02: 120 mg via EPIDURAL

## 2014-12-02 NOTE — Discharge Instructions (Signed)

## 2014-12-05 ENCOUNTER — Other Ambulatory Visit: Payer: Commercial Managed Care - HMO

## 2014-12-07 ENCOUNTER — Other Ambulatory Visit: Payer: Commercial Managed Care - HMO

## 2014-12-09 ENCOUNTER — Telehealth: Payer: Self-pay | Admitting: Internal Medicine

## 2014-12-09 DIAGNOSIS — M5416 Radiculopathy, lumbar region: Secondary | ICD-10-CM

## 2014-12-09 NOTE — Telephone Encounter (Signed)
Pt spouse called requesting referral to [facility] Doristine [facility]incent [facility] for ruptured disc in back causing pain down leg.    [employer] auth required.  They can go anytime to see Dr. Renae FicklePaul

## 2014-12-10 NOTE — Telephone Encounter (Signed)
Referral made 

## 2014-12-20 ENCOUNTER — Other Ambulatory Visit: Payer: Self-pay | Admitting: Orthopedic Surgery

## 2014-12-23 ENCOUNTER — Encounter (HOSPITAL_COMMUNITY)
Admission: RE | Admit: 2014-12-23 | Discharge: 2014-12-23 | Disposition: A | Discharge status: Home / Self Care | Payer: Commercial Managed Care - HMO | Source: Ambulatory Visit | Attending: Orthopedic Surgery | Admitting: Orthopedic Surgery

## 2014-12-23 ENCOUNTER — Telehealth: Payer: Self-pay | Admitting: Internal Medicine

## 2014-12-23 ENCOUNTER — Encounter (HOSPITAL_COMMUNITY): Payer: Self-pay

## 2014-12-23 DIAGNOSIS — M353 Polymyalgia rheumatica: Secondary | ICD-10-CM | POA: Diagnosis not present

## 2014-12-23 DIAGNOSIS — Z01812 Encounter for preprocedural laboratory examination: Secondary | ICD-10-CM | POA: Insufficient documentation

## 2014-12-23 DIAGNOSIS — Z79899 Other long term (current) drug therapy: Secondary | ICD-10-CM | POA: Diagnosis not present

## 2014-12-23 DIAGNOSIS — Z955 Presence of coronary angioplasty implant and graft: Secondary | ICD-10-CM | POA: Insufficient documentation

## 2014-12-23 DIAGNOSIS — Z7982 Long term (current) use of aspirin: Secondary | ICD-10-CM | POA: Insufficient documentation

## 2014-12-23 DIAGNOSIS — I252 Old myocardial infarction: Secondary | ICD-10-CM | POA: Insufficient documentation

## 2014-12-23 DIAGNOSIS — Z87891 Personal history of nicotine dependence: Secondary | ICD-10-CM | POA: Diagnosis not present

## 2014-12-23 DIAGNOSIS — E785 Hyperlipidemia, unspecified: Secondary | ICD-10-CM | POA: Insufficient documentation

## 2014-12-23 DIAGNOSIS — E039 Hypothyroidism, unspecified: Secondary | ICD-10-CM | POA: Diagnosis not present

## 2014-12-23 DIAGNOSIS — Z01818 Encounter for other preprocedural examination: Secondary | ICD-10-CM | POA: Diagnosis present

## 2014-12-23 DIAGNOSIS — I4510 Unspecified right bundle-branch block: Secondary | ICD-10-CM | POA: Insufficient documentation

## 2014-12-23 DIAGNOSIS — I2510 Atherosclerotic heart disease of native coronary artery without angina pectoris: Secondary | ICD-10-CM | POA: Insufficient documentation

## 2014-12-23 LAB — PROTIME-INR
INR: 1.04 (ref 0.00–1.49)
PROTHROMBIN TIME: 13.8 s (ref 11.6–15.2)

## 2014-12-23 LAB — COMPREHENSIVE METABOLIC PANEL
ALBUMIN: 3.9 g/dL (ref 3.5–5.0)
ALT: 18 U/L (ref 17–63)
AST: 22 U/L (ref 15–41)
Alkaline Phosphatase: 51 U/L (ref 38–126)
Anion gap: 5 (ref 5–15)
BUN: 9 mg/dL (ref 6–20)
CALCIUM: 9.3 mg/dL (ref 8.9–10.3)
CHLORIDE: 106 mmol/L (ref 101–111)
CO2: 28 mmol/L (ref 22–32)
Creatinine, Ser: 0.86 mg/dL (ref 0.61–1.24)
Glucose, Bld: 81 mg/dL (ref 65–99)
Potassium: 4.5 mmol/L (ref 3.5–5.1)
SODIUM: 139 mmol/L (ref 135–145)
TOTAL PROTEIN: 6.3 g/dL — AB (ref 6.5–8.1)
Total Bilirubin: 1.1 mg/dL (ref 0.3–1.2)

## 2014-12-23 LAB — URINALYSIS, ROUTINE W REFLEX MICROSCOPIC
BILIRUBIN URINE: NEGATIVE
GLUCOSE, UA: NEGATIVE mg/dL
Hgb urine dipstick: NEGATIVE
Ketones, ur: 15 mg/dL — AB
LEUKOCYTES UA: NEGATIVE
NITRITE: NEGATIVE
PROTEIN: NEGATIVE mg/dL
SPECIFIC GRAVITY, URINE: 1.02 (ref 1.005–1.030)
Urobilinogen, UA: 0.2 mg/dL (ref 0.0–1.0)
pH: 5 (ref 5.0–8.0)

## 2014-12-23 LAB — SURGICAL PCR SCREEN
MRSA, PCR: NEGATIVE
Staphylococcus aureus: NEGATIVE

## 2014-12-23 LAB — CBC WITH DIFFERENTIAL/PLATELET
Basophils Absolute: 0 10*3/uL (ref 0.0–0.1)
Basophils Relative: 0 % (ref 0–1)
EOS ABS: 0.1 10*3/uL (ref 0.0–0.7)
Eosinophils Relative: 3 % (ref 0–5)
HCT: 43.8 % (ref 39.0–52.0)
HEMOGLOBIN: 15.3 g/dL (ref 13.0–17.0)
Lymphocytes Relative: 20 % (ref 12–46)
Lymphs Abs: 1.1 10*3/uL (ref 0.7–4.0)
MCH: 33.3 pg (ref 26.0–34.0)
MCHC: 34.9 g/dL (ref 30.0–36.0)
MCV: 95.2 fL (ref 78.0–100.0)
MONO ABS: 0.8 10*3/uL (ref 0.1–1.0)
MONOS PCT: 14 % — AB (ref 3–12)
Neutro Abs: 3.5 10*3/uL (ref 1.7–7.7)
Neutrophils Relative %: 63 % (ref 43–77)
PLATELETS: 251 10*3/uL (ref 150–400)
RBC: 4.6 MIL/uL (ref 4.22–5.81)
RDW: 13.2 % (ref 11.5–15.5)
WBC: 5.5 10*3/uL (ref 4.0–10.5)

## 2014-12-23 LAB — APTT: APTT: 26 s (ref 24–37)

## 2014-12-23 NOTE — Progress Notes (Signed)
Anesthesia PAT Evaluation: Patient is a 74 year old male scheduled for left sided L5-S1 microdiscectomy on 12/28/14 by Dr. Yevette Edwards. OR room is booked for 3 hours, although patient says Dr. Yevette Edwards said this would be 1 1/2 hours with plans to discharge later that day if he was doing well.  History includes CAD/inferior MI s/p BMS RCA and mid AV CX 04/07/00 with re-occlusion RCA stent (STEMI) within 24 hours s/p BMS RCA 04/08/00, former smoker, PAD with RLE claudication, HLD, hypothyroidism, polymyalgia rheumatica (no longer requires prednisone), arthritis, appendectomy.  PCP is Dr. Tillman Abide. Cardiologist is Dr. Peter Swaziland, but last visit was on 12/11/10.   Meds include ASA 81 mg, Lopressor, Niaspan, ramipril, Percocet, Synthroid.  I was asked to evaluate patient due to his cardiac history and no recent cardiology follow-up. He has had regular follow-up with his PCP. Patient stays active, but pulled his back which loading hay in late April 2016. Prior to his injury, he was able to weed eat and load hay for two hours at a time without resting. He denied chest pain or any significant SOB with this level of exertion. He denies edema. He now has lower back and LLE pain. Exam shows a pleasant Caucasian male in NAD. Heart RRR, no murmur noted. Lungs clear. No pre-tibial edema. Good mouth opening.  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 Overall, I think his EKG appears similar to prior tracings in 2010 and 2007.  Nuclear stress test 12/06/08: Abnormal stress nuclear study demonstrates a fixed inferior wall defect consistent with prior infarct. No ischemia. There is inferior hypokinesis with overall good LVF. EF 53%.    04/07/00 LHC: 30% mid LM, 30-40% mild diffuse disease in the LAD (small). Medium LCX with 90% mid AV section between the two trivial marginal branches, which is followed by a hazy 70% narrowing. The remainder of the LCX has mild diffuse disease. Dominant RCA with 100% proximal  occlusion. The mid RCA has severe diffuse disease along its course descending into the distal RCA with a long 6070% narrowing up to and encroaching into the ostium of the PDA. PDA with mild irregularities. LV with mildly dilated systolic and end-diastolic dimensions. Overall LVF is moderately impaired, EF 30% with akinesis of the inferior wall.  FINAL RESULT: 1. Successful percutaneous transluminal coronary angioplasty and stenting of the mid right coronary artery with reduction of 100% narrowing to 0% with placement of a 3.5 x 28 mm Penta stent preceded by a 3.5 x 13 mm Penta stent. 2. Successful primary stenting of the mid AV circumflex with reduction of sequential 90% and 70% hazy areas to 0% with placement of a 3.0 x 18 mm Penta stent. He required emergency LHC for inferior ST elevation and returned for emergency LHC on 04/08/00 and underwent:  FINAL RESULT: Successful PTCA and stenting of the mid right coronary artery with reduction of 100% InStent thrombus to 0%, and reduction of 90% mid right coronary artery edge dissection to less than 10% with placement of a 3.0 x 28 mm Penta stent.  12/23/14 CXR: IMPRESSION: There is no active cardiopulmonary disease.  Preoperative labs noted.  Discussed above with anesthesiologist Dr. Renold Don. Patient has had recent exercise tolerance > 4 METS and EKG appears stable, but with known CAD/MI history and last cardiology visit 4 years ago and last functional study 6 years ago then would recommend cardiac clearance. Patient aware. Carla at Dr. Marshell Levan office will arrange and notify patient.   Shonna Chock, PA-C [facility]  Short Stay Center/Anesthesiology Phone [phone] 12/23/2014 11:11 AM

## 2014-12-23 NOTE — Progress Notes (Signed)
   12/23/14 0919  OBSTRUCTIVE SLEEP APNEA  Have you ever been diagnosed with sleep apnea through a sleep study? No  Do you snore loudly (loud enough to be heard through closed doors)?  0  Do you often feel tired, fatigued, or sleepy during the daytime? 0  Has anyone observed you stop breathing during your sleep? 0  Do you have, or are you being treated for high blood pressure? 0  BMI more than 35 kg/m2? 1  Age over 72 years old? 1  Neck circumference greater than 40 cm/16 inches? 1  Gender: 1

## 2014-12-23 NOTE — Pre-Procedure Instructions (Addendum)
Darin [name]  12/23/2014      [facility] - [place], [place] - [id number] Darin [name] Darin [name] [place] [address] Phone: [phone] Fax: [phone]    Your procedure is scheduled on  Wednesday, December 28, 2014  Report to [facility] Admitting at  11:00 A.M.  Call this number if you have problems the morning of surgery:  [phone]   Remember:  Do not eat food or drink liquids after midnight Tuesday, December 27, 2014  Take these medicines the morning of surgery with A SIP OF WATER :metoprolol tartrate (LOPRESSOR), SYNTHROID, if needed:oxyCODONE-acetaminophen (PERCOCET) for pain  Stop taking Aspirin, vitamins and herbal medications. Do not take any NSAIDs ie: Ibuprofen, Advil, Naproxen or any medication containing Aspirin; stop now.  Do not wear jewelry, make-up or nail polish.  Do not wear lotions, powders, or perfumes.  You may not wear deodorant.  Do not shave 48 hours prior to surgery.  Men may shave face and neck.  Do not bring valuables to the hospital.  [facility] is not responsible for any belongings or valuables.  Contacts, dentures or bridgework may not be worn into surgery.  Leave your suitcase in the car.  After surgery it may be brought to your room.  For patients admitted to the hospital, discharge time will be determined by your treatment team.  Patients discharged the day of surgery will not be allowed to drive home.   Name and phone number of your driver:   Special instructions:  Darin [name] - Preparing for Surgery  Before surgery, you can play an important role.  Because skin is not sterile, your skin needs to be as free of germs as possible.  You can reduce the number of germs on you skin by washing with CHG (chlorahexidine gluconate) soap before surgery.  CHG is an antiseptic cleaner which kills germs and bonds with the skin to continue killing germs even after washing.  Please DO NOT use if you have an  allergy to CHG or antibacterial soaps.  If your skin becomes reddened/irritated stop using the CHG and inform your nurse when you arrive at Short Stay.  Do not shave (including legs and underarms) for at least 48 hours prior to the first CHG shower.  You may shave your face.  Please follow these instructions carefully:   1.  Shower with CHG Soap the night before surgery and the morning of Surgery.  2.  If you choose to wash your hair, wash your hair first as usual with your normal shampoo.  3.  After you shampoo, rinse your hair and body thoroughly to remove the Shampoo.  4.  Use CHG as you would any other liquid soap.  You can apply chg directly  to the skin and wash gently with scrungie or a clean washcloth.  5.  Apply the CHG Soap to your body ONLY FROM THE NECK DOWN.  Do not use on open wounds or open sores.  Avoid contact with your eyes, ears, mouth and genitals (private parts).  Wash genitals (private parts) with your normal soap.  6.  Wash thoroughly, paying special attention to the area where your surgery will be performed.  7.  Thoroughly rinse your body with warm water from the neck down.  8.  DO NOT shower/wash with your normal soap after using and rinsing off the CHG Soap.  9.  Pat yourself dry with a clean towel.  10.  Wear clean pajamas.            11.  Place clean sheets on your bed the night of your first shower and do not sleep with pets.  Day of Surgery  Do not apply any lotions/deodorants the morning of surgery.  Please wear clean clothes to the hospital/surgery center.  Please read over the following fact sheets that you were given. Pain Booklet, Coughing and Deep Breathing, MRSA Information and Surgical Site Infection Prevention

## 2014-12-23 NOTE — Progress Notes (Signed)
   12/23/14 0919  OBSTRUCTIVE SLEEP APNEA  Have you ever been diagnosed with sleep apnea through a sleep study? No  Do you snore loudly (loud enough to be heard through closed doors)?  0  Do you often feel tired, fatigued, or sleepy during the daytime? 0  Has anyone observed you stop breathing during your sleep? 0  Do you have, or are you being treated for high blood pressure? 0  BMI more than 35 kg/m2? 1  Age over 72 years old? 1  Neck circumference greater than 40 cm/16 inches? 1  Gender: 1   

## 2014-12-23 NOTE — Progress Notes (Signed)
PCP:Dr. Tillman Abide   Cardiologist: Dr. Theron Arista Jordan---Last visit 2012  Called Dr. Marshell Levan office to clarify what time pt. Needs to arrive. Spoke to [place] and she stated for him to arrive at 1100.

## 2014-12-26 NOTE — H&P (Signed)
PREOPERATIVE H&P  Chief Complaint: Left leg pain  HPI: Darin [name] is a 74 y.o. male who presents with ongoing pain in the left leg  MRI reveals a very large left L5/S1 HNP  Patient has failed multiple forms of conservative care and continues to have pain (see office notes for additional details regarding the patient's full course of treatment)  Past Medical History  Diagnosis Date  . Acute MI, inferior wall 2001    TREATED WITH DIRECT STENTING OF THE RIGHT CORONARY. HE HAS 3 BARE-METAL STENTS IN THE RIGHT CORONARY AND ONE IN THE LEFT CIRCUMFLEX CORONARY  . PAD (peripheral artery disease)   . Claudication     CHRONIC RIGHT LEG  . Hyperlipidemia   . Hypothyroidism   . Coronary artery disease   . History of polymyalgia rheumatica   . Allergy   . Arthritis    Past Surgical History  Procedure Laterality Date  . Cardiac catheterization  04/08/2000    EF 30%. LV FUNCTION MODERATELY IMPAIRED. THERE IS AKINESIS OF THE INFERIOR WALL.  Marland Kitchen Appendectomy    . Hernia repair    . Coronary stent placement    . Cardiolite study  11/2008    SHOWED FIXED INFERIOR WALL DEFECT WITH A NORMAL EF 53%  . Coronary angioplasty with stent placement     History   Social History  . Marital Status: Married    Spouse Name: N/A  . Number of Children: 2  . Years of Education: N/A   Occupational History  . [employer]- retired    Social History Main Topics  . Smoking status: Former Smoker    Quit date: 05/27/1998  . Smokeless tobacco: Never Used  . Alcohol Use: No  . Drug Use: No  . Sexual Activity: Not on file   Other Topics Concern  . Not on file   Social History Narrative   No living will   Wife should be health care POA   Would accept resuscitation attempts   No tube feeds if cognitively unaware   Family History  Problem Relation Age of Onset  . Hypertension Mother   . Lung cancer Father   . Kidney failure Father   . Hypertension Brother    Allergies  Allergen  Reactions  . Atorvastatin     REACTION: Aching   Prior to Admission medications   Medication Sig Start Date End Date Taking? Authorizing Provider  aspirin 81 MG tablet Take 81 mg by mouth daily.    Historical Provider, MD  gabapentin (NEURONTIN) 300 MG capsule Take 1 capsule (300 mg total) by mouth at bedtime. Patient not taking: Reported on 12/21/2014 11/22/14   Judi Saa, DO  metoprolol tartrate (LOPRESSOR) 25 MG tablet TAKE ONE (1) TABLET BY MOUTH TWO (2) TIMES DAILY 08/23/13   Karie Schwalbe, MD  Multiple Vitamin (MULTIVITAMIN) tablet Take 1 tablet by mouth daily.      Historical Provider, MD  niacin (NIASPAN) 1000 MG CR tablet Take 1 tablet (1,000 mg total) by mouth at bedtime. 10/07/11   Karie Schwalbe, MD  oxyCODONE-acetaminophen (PERCOCET) 10-325 MG per tablet Take 1 tablet by mouth every 4 (four) hours as needed for pain.    Historical Provider, MD  ramipril (ALTACE) 10 MG capsule TAKE ONE CAPSULE BY MOUTH DAILY 09/27/14   Karie Schwalbe, MD  SYNTHROID 125 MCG tablet Take 1 tablet (125 mcg total) by mouth daily. 07/12/14   Karie Schwalbe, MD  traMADol Janean Sark)  50 MG tablet Take 1 tablet (50 mg total) by mouth every 8 (eight) hours as needed. Patient not taking: Reported on 12/21/2014 11/30/14   Judi Saa, DO     All other systems have been reviewed and were otherwise negative with the exception of those mentioned in the HPI and as above.  Physical Exam: There were no vitals filed for this visit.  General: Alert, no acute distress Cardiovascular: No pedal edema Respiratory: No cyanosis, no use of accessory musculature Skin: No lesions in the area of chief complaint Neurologic: Sensation intact distally Psychiatric: Patient is competent for consent with normal mood and affect Lymphatic: No axillary or cervical lymphadenopathy  MUSCULOSKELETAL: + SLR on left  Assessment/Plan: Left leg pain Plan for Procedure(s): LUMBAR LAMINECTOMY/DECOMPRESSION  MICRODISCECTOMY   Emilee Hero, MD 12/26/2014 12:53 PM

## 2014-12-27 NOTE — Progress Notes (Signed)
Anesthesia Follow-up: See my note from 12/23/14. Patient was seen by cardiologist Dr. Algie Coffer for pre-operative clearance.   Echo done at his office on 12/26/14 showed: Borderline LV size and mild systolic and diastolic dysfunction with mild generalized hypokinesia, LVEF 40-45%. Normal RV size and systolic function. Dilated LA and RA size. Normal MV, TV, AV, PV with mild MR, TR, AI, PI. No intracardiac mass. No pericardial effusion.   Following evaluation and echo results, Dr. Algie Coffer wrote, "In absence of symptomatic chest pain, may undergo surgery with hemodynamic monitoring."  [facility] Stay Center/Anesthesiology Phone [phone] 12/27/2014 9:23 AM

## 2014-12-28 ENCOUNTER — Ambulatory Visit (HOSPITAL_COMMUNITY): Payer: Commercial Managed Care - HMO | Admitting: Vascular Surgery

## 2014-12-28 ENCOUNTER — Encounter (HOSPITAL_COMMUNITY): Payer: Self-pay | Admitting: Certified Registered"

## 2014-12-28 ENCOUNTER — Encounter (HOSPITAL_COMMUNITY)
Admission: RE | Disposition: A | Discharge status: Home / Self Care | Payer: Self-pay | Source: Ambulatory Visit | Attending: Orthopedic Surgery

## 2014-12-28 ENCOUNTER — Ambulatory Visit (HOSPITAL_COMMUNITY)
Admission: RE | Admit: 2014-12-28 | Discharge: 2014-12-28 | Disposition: A | Discharge status: Home / Self Care | Payer: Commercial Managed Care - HMO | Source: Ambulatory Visit | Attending: Orthopedic Surgery | Admitting: Orthopedic Surgery

## 2014-12-28 ENCOUNTER — Ambulatory Visit (HOSPITAL_COMMUNITY)
Admission: RE | Admit: 2014-12-28 | Payer: Commercial Managed Care - HMO | Source: Ambulatory Visit | Admitting: Orthopedic Surgery

## 2014-12-28 ENCOUNTER — Encounter (HOSPITAL_COMMUNITY): Admission: RE | Payer: Self-pay | Source: Ambulatory Visit

## 2014-12-28 ENCOUNTER — Ambulatory Visit (HOSPITAL_COMMUNITY): Payer: Commercial Managed Care - HMO

## 2014-12-28 DIAGNOSIS — I252 Old myocardial infarction: Secondary | ICD-10-CM | POA: Diagnosis not present

## 2014-12-28 DIAGNOSIS — Z419 Encounter for procedure for purposes other than remedying health state, unspecified: Secondary | ICD-10-CM

## 2014-12-28 DIAGNOSIS — E039 Hypothyroidism, unspecified: Secondary | ICD-10-CM | POA: Diagnosis not present

## 2014-12-28 DIAGNOSIS — Z955 Presence of coronary angioplasty implant and graft: Secondary | ICD-10-CM | POA: Diagnosis not present

## 2014-12-28 DIAGNOSIS — M5117 Intervertebral disc disorders with radiculopathy, lumbosacral region: Secondary | ICD-10-CM | POA: Insufficient documentation

## 2014-12-28 DIAGNOSIS — I2510 Atherosclerotic heart disease of native coronary artery without angina pectoris: Secondary | ICD-10-CM | POA: Diagnosis not present

## 2014-12-28 DIAGNOSIS — E785 Hyperlipidemia, unspecified: Secondary | ICD-10-CM | POA: Diagnosis not present

## 2014-12-28 DIAGNOSIS — Z87891 Personal history of nicotine dependence: Secondary | ICD-10-CM | POA: Diagnosis not present

## 2014-12-28 HISTORY — PX: LUMBAR LAMINECTOMY/DECOMPRESSION MICRODISCECTOMY: SHX5026

## 2014-12-28 SURGERY — LUMBAR LAMINECTOMY/DECOMPRESSION MICRODISCECTOMY
Anesthesia: General | Site: Back | Laterality: Left

## 2014-12-28 SURGERY — LUMBAR LAMINECTOMY/DECOMPRESSION MICRODISCECTOMY
Anesthesia: General | Laterality: Left

## 2014-12-28 MED ORDER — METHYLPREDNISOLONE ACETATE 40 MG/ML IJ SUSP
INTRAMUSCULAR | Status: AC
  Filled 2014-12-28: qty 1

## 2014-12-28 MED ORDER — LACTATED RINGERS IV SOLN
INTRAVENOUS | Status: DC
  Administered 2014-12-28 (×3): via INTRAVENOUS

## 2014-12-28 MED ORDER — BUPIVACAINE-EPINEPHRINE (PF) 0.25% -1:200000 IJ SOLN
INTRAMUSCULAR | Status: AC
  Filled 2014-12-28: qty 30

## 2014-12-28 MED ORDER — ACETAMINOPHEN 325 MG PO TABS
ORAL_TABLET | ORAL | Status: DC | PRN
  Administered 2014-12-28: 1000 mg via ORAL

## 2014-12-28 MED ORDER — KETOROLAC TROMETHAMINE 30 MG/ML IJ SOLN
INTRAMUSCULAR | Status: DC | PRN
  Administered 2014-12-28: 30 mg via INTRAVENOUS

## 2014-12-28 MED ORDER — DEXAMETHASONE SODIUM PHOSPHATE 4 MG/ML IJ SOLN
INTRAMUSCULAR | Status: DC | PRN
  Administered 2014-12-28 (×2): 4 mg via INTRAVENOUS

## 2014-12-28 MED ORDER — HYDROMORPHONE HCL 1 MG/ML IJ SOLN
INTRAMUSCULAR | Status: DC | PRN
  Administered 2014-12-28 (×2): 0.5 mg via INTRAVENOUS

## 2014-12-28 MED ORDER — METOPROLOL TARTRATE 1 MG/ML IV SOLN
INTRAVENOUS | Status: DC | PRN
  Administered 2014-12-28 (×2): 2 mg via INTRAVENOUS
  Administered 2014-12-28: 1 mg via INTRAVENOUS

## 2014-12-28 MED ORDER — THROMBIN 20000 UNITS EX SOLR
CUTANEOUS | Status: AC
  Filled 2014-12-28: qty 20000

## 2014-12-28 MED ORDER — METHYLENE BLUE 1 % INJ SOLN
INTRAMUSCULAR | Status: AC
  Filled 2014-12-28: qty 10

## 2014-12-28 MED ORDER — ONDANSETRON HCL 4 MG/2ML IJ SOLN
INTRAMUSCULAR | Status: DC | PRN
  Administered 2014-12-28: 8 mg via INTRAVENOUS

## 2014-12-28 MED ORDER — METHYLENE BLUE 1 % INJ SOLN
INTRAMUSCULAR | Status: DC | PRN
  Administered 2014-12-28: 1 mL via SUBMUCOSAL

## 2014-12-28 MED ORDER — METHYLPREDNISOLONE ACETATE 40 MG/ML IJ SUSP
INTRAMUSCULAR | Status: DC | PRN
  Administered 2014-12-28: 40 mg

## 2014-12-28 MED ORDER — NEOSTIGMINE METHYLSULFATE 10 MG/10ML IV SOLN
INTRAVENOUS | Status: DC | PRN
Start: 2014-12-28 — End: 2014-12-28
  Administered 2014-12-28: 5 mg via INTRAVENOUS

## 2014-12-28 MED ORDER — ROCURONIUM BROMIDE 100 MG/10ML IV SOLN
INTRAVENOUS | Status: DC | PRN
  Administered 2014-12-28: 30 mg via INTRAVENOUS

## 2014-12-28 MED ORDER — SUCCINYLCHOLINE CHLORIDE 20 MG/ML IJ SOLN
INTRAMUSCULAR | Status: DC | PRN
  Administered 2014-12-28: 100 mg via INTRAVENOUS

## 2014-12-28 MED ORDER — PHENYLEPHRINE HCL 10 MG/ML IJ SOLN
INTRAMUSCULAR | Status: DC | PRN
  Administered 2014-12-28 (×3): 80 ug via INTRAVENOUS

## 2014-12-28 MED ORDER — CEFAZOLIN SODIUM-DEXTROSE 2-3 GM-% IV SOLR
2.0000 g | INTRAVENOUS | Status: AC
  Administered 2014-12-28: 2 g via INTRAVENOUS
  Filled 2014-12-28: qty 50

## 2014-12-28 MED ORDER — BUPIVACAINE-EPINEPHRINE 0.25% -1:200000 IJ SOLN
INTRAMUSCULAR | Status: DC | PRN
  Administered 2014-12-28: 10 mL

## 2014-12-28 MED ORDER — FENTANYL CITRATE (PF) 100 MCG/2ML IJ SOLN
INTRAMUSCULAR | Status: DC | PRN
  Administered 2014-12-28: 150 ug via INTRAVENOUS

## 2014-12-28 MED ORDER — LIDOCAINE HCL (CARDIAC) 20 MG/ML IV SOLN
INTRAVENOUS | Status: DC | PRN
  Administered 2014-12-28: 80 mg via INTRAVENOUS

## 2014-12-28 MED ORDER — HEMOSTATIC AGENTS (NO CHARGE) OPTIME
TOPICAL | Status: DC | PRN
  Administered 2014-12-28: 1 via TOPICAL

## 2014-12-28 MED ORDER — POVIDONE-IODINE 7.5 % EX SOLN
Freq: Once | CUTANEOUS | Status: DC
  Filled 2014-12-28: qty 118

## 2014-12-28 MED ORDER — THROMBIN 20000 UNITS EX SOLR
CUTANEOUS | Status: DC | PRN
  Administered 2014-12-28: 14:00:00 via TOPICAL

## 2014-12-28 MED ORDER — PROPOFOL 10 MG/ML IV BOLUS
INTRAVENOUS | Status: DC | PRN
  Administered 2014-12-28: 150 mg via INTRAVENOUS

## 2014-12-28 MED ORDER — GLYCOPYRROLATE 0.2 MG/ML IJ SOLN
INTRAMUSCULAR | Status: DC | PRN
  Administered 2014-12-28: .8 mg via INTRAVENOUS

## 2014-12-28 MED ORDER — 0.9 % SODIUM CHLORIDE (POUR BTL) OPTIME
TOPICAL | Status: DC | PRN
  Administered 2014-12-28: 1000 mL

## 2014-12-28 SURGICAL SUPPLY — 72 items
BENZOIN TINCTURE PRP APPL 2/3 (GAUZE/BANDAGES/DRESSINGS) ×3 IMPLANT
BUR ROUND PRECISION 4.0 (BURR) ×2 IMPLANT
BUR ROUND PRECISION 4.0MM (BURR) ×1
CANISTER SUCTION 2500CC (MISCELLANEOUS) ×3 IMPLANT
CARTRIDGE OIL MAESTRO DRILL (MISCELLANEOUS) ×1 IMPLANT
CLOSURE STERI-STRIP 1/2X4 (GAUZE/BANDAGES/DRESSINGS) ×1
CLOSURE WOUND 1/2 X4 (GAUZE/BANDAGES/DRESSINGS) ×1
CLSR STERI-STRIP ANTIMIC 1/2X4 (GAUZE/BANDAGES/DRESSINGS) ×2 IMPLANT
CORDS BIPOLAR (ELECTRODE) ×3 IMPLANT
COVER SURGICAL LIGHT HANDLE (MISCELLANEOUS) ×3 IMPLANT
DIFFUSER DRILL AIR PNEUMATIC (MISCELLANEOUS) ×3 IMPLANT
DRAIN CHANNEL 15F RND FF W/TCR (WOUND CARE) IMPLANT
DRAPE POUCH INSTRU U-SHP 10X18 (DRAPES) ×6 IMPLANT
DRAPE SURG 17X23 STRL (DRAPES) ×12 IMPLANT
DURAPREP 26ML APPLICATOR (WOUND CARE) ×3 IMPLANT
ELECT BLADE 4.0 EZ CLEAN MEGAD (MISCELLANEOUS) ×3
ELECT CAUTERY BLADE 6.4 (BLADE) ×3 IMPLANT
ELECT REM PT RETURN 9FT ADLT (ELECTROSURGICAL) ×3
ELECTRODE BLDE 4.0 EZ CLN MEGD (MISCELLANEOUS) ×1 IMPLANT
ELECTRODE REM PT RTRN 9FT ADLT (ELECTROSURGICAL) ×1 IMPLANT
EVACUATOR SILICONE 100CC (DRAIN) IMPLANT
FILTER STRAW FLUID ASPIR (MISCELLANEOUS) ×3 IMPLANT
GAUZE SPONGE 4X4 12PLY STRL (GAUZE/BANDAGES/DRESSINGS) ×3 IMPLANT
GAUZE SPONGE 4X4 16PLY XRAY LF (GAUZE/BANDAGES/DRESSINGS) ×6 IMPLANT
GLOVE BIO SURGEON STRL SZ7 (GLOVE) ×3 IMPLANT
GLOVE BIO SURGEON STRL SZ8 (GLOVE) ×3 IMPLANT
GLOVE BIOGEL PI IND STRL 7.0 (GLOVE) ×1 IMPLANT
GLOVE BIOGEL PI IND STRL 8 (GLOVE) ×1 IMPLANT
GLOVE BIOGEL PI INDICATOR 7.0 (GLOVE) ×2
GLOVE BIOGEL PI INDICATOR 8 (GLOVE) ×2
GOWN STRL REUS W/ TWL LRG LVL3 (GOWN DISPOSABLE) ×1 IMPLANT
GOWN STRL REUS W/ TWL XL LVL3 (GOWN DISPOSABLE) ×2 IMPLANT
GOWN STRL REUS W/TWL LRG LVL3 (GOWN DISPOSABLE) ×2
GOWN STRL REUS W/TWL XL LVL3 (GOWN DISPOSABLE) ×4
IV CATH 14GX2 1/4 (CATHETERS) ×3 IMPLANT
KIT BASIN OR (CUSTOM PROCEDURE TRAY) ×3 IMPLANT
KIT POSITION SURG JACKSON T1 (MISCELLANEOUS) ×3 IMPLANT
KIT ROOM TURNOVER OR (KITS) ×3 IMPLANT
NEEDLE 18GX1X1/2 (RX/OR ONLY) (NEEDLE) ×3 IMPLANT
NEEDLE 22X1 1/2 (OR ONLY) (NEEDLE) ×3 IMPLANT
NEEDLE HYPO 25GX1X1/2 BEV (NEEDLE) ×3 IMPLANT
NEEDLE SPNL 18GX3.5 QUINCKE PK (NEEDLE) ×6 IMPLANT
NS IRRIG 1000ML POUR BTL (IV SOLUTION) ×3 IMPLANT
OIL CARTRIDGE MAESTRO DRILL (MISCELLANEOUS) ×3
PACK LAMINECTOMY ORTHO (CUSTOM PROCEDURE TRAY) ×3 IMPLANT
PACK UNIVERSAL I (CUSTOM PROCEDURE TRAY) ×3 IMPLANT
PAD ARMBOARD 7.5X6 YLW CONV (MISCELLANEOUS) ×6 IMPLANT
PATTIES SURGICAL .5 X.5 (GAUZE/BANDAGES/DRESSINGS) IMPLANT
PATTIES SURGICAL .5 X1 (DISPOSABLE) ×3 IMPLANT
SPONGE INTESTINAL PEANUT (DISPOSABLE) ×3 IMPLANT
SPONGE SURGIFOAM ABS GEL 100 (HEMOSTASIS) ×3 IMPLANT
SPONGE SURGIFOAM ABS GEL SZ50 (HEMOSTASIS) ×3 IMPLANT
STRIP CLOSURE SKIN 1/2X4 (GAUZE/BANDAGES/DRESSINGS) ×2 IMPLANT
SURGIFLO W/THROMBIN 8M KIT (HEMOSTASIS) IMPLANT
SUT MNCRL AB 4-0 PS2 18 (SUTURE) ×3 IMPLANT
SUT VIC AB 0 CT1 18XCR BRD 8 (SUTURE) ×1 IMPLANT
SUT VIC AB 0 CT1 27 (SUTURE) ×2
SUT VIC AB 0 CT1 27XBRD ANBCTR (SUTURE) ×1 IMPLANT
SUT VIC AB 0 CT1 8-18 (SUTURE) ×2
SUT VIC AB 1 CT1 18XCR BRD 8 (SUTURE) ×1 IMPLANT
SUT VIC AB 1 CT1 8-18 (SUTURE) ×2
SUT VIC AB 2-0 CT2 18 VCP726D (SUTURE) ×3 IMPLANT
SYR 20CC LL (SYRINGE) IMPLANT
SYR BULB IRRIGATION 50ML (SYRINGE) ×3 IMPLANT
SYR CONTROL 10ML LL (SYRINGE) ×6 IMPLANT
SYR TB 1ML 26GX3/8 SAFETY (SYRINGE) ×6 IMPLANT
SYR TB 1ML LUER SLIP (SYRINGE) ×6 IMPLANT
TAPE CLOTH SURG 4X10 WHT LF (GAUZE/BANDAGES/DRESSINGS) ×3 IMPLANT
TOWEL OR 17X24 6PK STRL BLUE (TOWEL DISPOSABLE) ×3 IMPLANT
TOWEL OR 17X26 10 PK STRL BLUE (TOWEL DISPOSABLE) ×3 IMPLANT
WATER STERILE IRR 1000ML POUR (IV SOLUTION) ×3 IMPLANT
YANKAUER SUCT BULB TIP NO VENT (SUCTIONS) ×3 IMPLANT

## 2014-12-28 NOTE — Anesthesia Postprocedure Evaluation (Signed)
  Anesthesia Post-op Note  Patient: Darin [name]  Procedure(s) Performed: Procedure(s) with comments: LUMBAR LAMINECTOMY/DECOMPRESSION MICRODISCECTOMY (Left) - Left sided lumbar 5-sacrum 1 microdisectomy  Patient Location: PACU  Anesthesia Type:General  Level of Consciousness: awake  Airway and Oxygen Therapy: Patient Spontanous Breathing  Post-op Pain: none  Post-op Assessment: Post-op Vital signs reviewed, Patient's Cardiovascular Status Stable, Respiratory Function Stable, Patent Airway, No signs of Nausea or vomiting and Pain level controlled   LLE Sensation: Full sensation, No numbness, No pain, No tingling   RLE Sensation: Full sensation, No numbness, No pain, No tingling      Post-op Vital Signs: Reviewed and stable  Last Vitals:  Filed Vitals:   12/28/14 1625  BP: 129/77  Pulse: 71  Temp:   Resp: 18    Complications: No apparent anesthesia complications

## 2014-12-28 NOTE — Transfer of Care (Signed)
Immediate Anesthesia Transfer of Care Note  Patient: Darin [name]  Procedure(s) Performed: Procedure(s) with comments: LUMBAR LAMINECTOMY/DECOMPRESSION MICRODISCECTOMY (Left) - Left sided lumbar 5-sacrum 1 microdisectomy  Patient Location: PACU  Anesthesia Type:General  Level of Consciousness: awake, alert , oriented and patient cooperative  Airway & Oxygen Therapy: Patient Spontanous Breathing  Post-op Assessment: Report given to RN, Post -op Vital signs reviewed and stable, Patient moving all extremities and Patient able to stick tongue midline  Post vital signs: Reviewed and stable  Last Vitals:  Filed Vitals:   12/28/14 1026  BP: 143/75  Pulse: 69  Temp: 36.1 C  Resp: 20    Complications: No apparent anesthesia complications

## 2014-12-28 NOTE — Anesthesia Preprocedure Evaluation (Addendum)
Anesthesia Evaluation  Patient identified by MRN, date of birth, ID band Patient awake    Reviewed: Allergy & Precautions, NPO status , Patient's Chart, lab work & pertinent test results, reviewed documented beta blocker date and time   Airway Mallampati: II  TM Distance: >3 FB Neck ROM: Full    Dental  (+) Upper Dentures, Dental Advidsory Given   Pulmonary former smoker,  breath sounds clear to auscultation        Cardiovascular Exercise Tolerance: Good METS: 3 - Mets + CAD, + Past MI (IMI managed with PCI BMS x 3 to RCA and x1 to LCX...no chest pain since...2001), + Cardiac Stents (PCI BMS x3 RCA x1 to LCX) and + Peripheral Vascular Disease (hx right femoral arterial occlusion ) Rhythm:Regular Rate:Normal  BMSx3 in 2001, no active symptoms.    Neuro/Psych  Neuromuscular disease (left leg pain to toes)    GI/Hepatic   Endo/Other  Hypothyroidism   Renal/GU      Musculoskeletal   Abdominal   Peds  Hematology   Anesthesia Other Findings   Reproductive/Obstetrics                          Lab Results  Component Value Date   WBC 5.5 12/23/2014   HGB 15.3 12/23/2014   HCT 43.8 12/23/2014   MCV 95.2 12/23/2014   PLT 251 12/23/2014   Lab Results  Component Value Date   CREATININE 0.86 12/23/2014   BUN 9 12/23/2014   NA 139 12/23/2014   K 4.5 12/23/2014   CL 106 12/23/2014   CO2 28 12/23/2014   Lab Results  Component Value Date   INR 1.04 12/23/2014   EKG: normal sinus rhythm, RBBB.  Anesthesia Physical Anesthesia Plan  ASA: III  Anesthesia Plan: General   Post-op Pain Management:    Induction: Intravenous  Airway Management Planned: Oral ETT  Additional Equipment:   Intra-op Plan:   Post-operative Plan:   Informed Consent: I have reviewed the patients History and Physical, chart, labs and discussed the procedure including the risks, benefits and alternatives for the  proposed anesthesia with the patient or authorized representative who has indicated his/her understanding and acceptance.   Dental advisory given  Plan Discussed with: CRNA  Anesthesia Plan Comments: (Anesthetic plan discussed in detail. Associated risk discussed including but not limited to life threatening cardiovascular, pulmonary events and dental damage. The postoperative pain management and antiemetic plan discussed with patient. All questions answered in detail. Patient is in agreement.   )        Anesthesia Quick Evaluation

## 2014-12-28 NOTE — Anesthesia Procedure Notes (Signed)
Procedure Name: Intubation Performed by: Renae Fickle Pre-anesthesia Checklist: Patient identified, Emergency Drugs available, Suction available and Patient being monitored Patient Re-evaluated:Patient Re-evaluated prior to inductionOxygen Delivery Method: Circle system utilized Preoxygenation: Pre-oxygenation with 100% oxygen Intubation Type: IV induction Ventilation: Mask ventilation without difficulty Laryngoscope Size: Mac and 4 Grade View: Grade II Tube type: Oral Tube size: 7.5 mm Number of attempts: 1 Placement Confirmation: ETT inserted through vocal cords under direct vision,  positive ETCO2 and breath sounds checked- equal and bilateral Secured at: 22 cm Tube secured with: Tape Dental Injury: Teeth and Oropharynx as per pre-operative assessment

## 2014-12-29 ENCOUNTER — Encounter (HOSPITAL_COMMUNITY): Payer: Self-pay | Admitting: Orthopedic Surgery

## 2014-12-29 ENCOUNTER — Emergency Department (HOSPITAL_COMMUNITY)
Admission: EM | Admit: 2014-12-29 | Discharge: 2015-01-26 | Disposition: E | Payer: Commercial Managed Care - HMO | Attending: Emergency Medicine | Admitting: Emergency Medicine

## 2014-12-29 ENCOUNTER — Ambulatory Visit: Payer: Commercial Managed Care - HMO | Admitting: Cardiovascular Disease

## 2014-12-29 DIAGNOSIS — Z9861 Coronary angioplasty status: Secondary | ICD-10-CM | POA: Diagnosis not present

## 2014-12-29 DIAGNOSIS — E039 Hypothyroidism, unspecified: Secondary | ICD-10-CM | POA: Diagnosis not present

## 2014-12-29 DIAGNOSIS — I2510 Atherosclerotic heart disease of native coronary artery without angina pectoris: Secondary | ICD-10-CM | POA: Insufficient documentation

## 2014-12-29 DIAGNOSIS — Z9889 Other specified postprocedural states: Secondary | ICD-10-CM | POA: Diagnosis not present

## 2014-12-29 DIAGNOSIS — Z87891 Personal history of nicotine dependence: Secondary | ICD-10-CM | POA: Diagnosis not present

## 2014-12-29 DIAGNOSIS — I469 Cardiac arrest, cause unspecified: Secondary | ICD-10-CM | POA: Insufficient documentation

## 2014-12-29 DIAGNOSIS — Z79899 Other long term (current) drug therapy: Secondary | ICD-10-CM | POA: Insufficient documentation

## 2014-12-29 DIAGNOSIS — I252 Old myocardial infarction: Secondary | ICD-10-CM | POA: Insufficient documentation

## 2014-12-29 DIAGNOSIS — E785 Hyperlipidemia, unspecified: Secondary | ICD-10-CM | POA: Diagnosis not present

## 2014-12-29 DIAGNOSIS — M1990 Unspecified osteoarthritis, unspecified site: Secondary | ICD-10-CM | POA: Diagnosis not present

## 2014-12-29 MED ORDER — SODIUM BICARBONATE 8.4 % IV SOLN
INTRAVENOUS | Status: AC | PRN
  Administered 2014-12-29: 50 meq via INTRAVENOUS

## 2014-12-29 MED ORDER — EPINEPHRINE HCL 0.1 MG/ML IJ SOSY
PREFILLED_SYRINGE | INTRAMUSCULAR | Status: AC | PRN
  Administered 2014-12-29 (×2): 1 mg via INTRAVENOUS

## 2014-12-29 MED ORDER — CALCIUM CHLORIDE 10 % IV SOLN
INTRAVENOUS | Status: AC | PRN
  Administered 2014-12-29: 1 g via INTRAVENOUS

## 2014-12-29 MED FILL — Medication: Qty: 1 | Status: AC

## 2015-01-26 NOTE — ED Provider Notes (Signed)
CSN: [id number]     Arrival date & time 12-31-14  1457 History   First MD Initiated Contact with Patient 31-Dec-2014 [phone]     Chief Complaint  Patient presents with  . Cardiac Arrest      HPI Patient brought to the emergency department with CPR in progress.  Patient had L5-S1 microdissection yesterday as an outpatient the outpatient surgical center.  Family reports the patient did well with this.  His morning he felt "weak" per the wife.  She left the house for several hours and when she returned she found him unresponsive on the couch.  CPR was initiated at the scene.  Initial rhythm on EMS arrival was asystole.  The patient was intubated and CPR was initiated.  Interosseous line placement was placed and the patient received epinephrine 11 in route.  On arrival to the emergency department CPR is in progress.  The patient remains in asystole.  Total CPR time at this time is just over 40 minutes   Past Medical History  Diagnosis Date  . Acute MI, inferior wall 2001    TREATED WITH DIRECT STENTING OF THE RIGHT CORONARY. HE HAS 3 BARE-METAL STENTS IN THE RIGHT CORONARY AND ONE IN THE LEFT CIRCUMFLEX CORONARY  . PAD (peripheral artery disease)   . Claudication     CHRONIC RIGHT LEG  . Hyperlipidemia   . Hypothyroidism   . Coronary artery disease   . History of polymyalgia rheumatica   . Allergy   . Arthritis    Past Surgical History  Procedure Laterality Date  . Cardiac catheterization  04/08/2000    EF 30%. LV FUNCTION MODERATELY IMPAIRED. THERE IS AKINESIS OF THE INFERIOR WALL.  Marland Kitchen Appendectomy    . Hernia repair    . Coronary stent placement    . Cardiolite study  11/2008    SHOWED FIXED INFERIOR WALL DEFECT WITH A NORMAL EF 53%  . Coronary angioplasty with stent placement    . Lumbar laminectomy/decompression microdiscectomy Left 12/28/2014    Procedure: LUMBAR LAMINECTOMY/DECOMPRESSION MICRODISCECTOMY;  Surgeon: Estill Bamberg, MD;  Location: [facility] OR;  Service: Orthopedics;  Laterality:  Left;  Left sided lumbar 5-sacrum 1 microdisectomy   Family History  Problem Relation Age of Onset  . Hypertension Mother   . Lung cancer Father   . Kidney failure Father   . Hypertension Brother    History  Substance Use Topics  . Smoking status: Former Smoker    Quit date: 05/27/1998  . Smokeless tobacco: Never Used  . Alcohol Use: No    Review of Systems  Unable to perform ROS     Allergies  Atorvastatin  Home Medications   Prior to Admission medications   Medication Sig Start Date End Date Taking? Authorizing Provider  gabapentin (NEURONTIN) 300 MG capsule Take 1 capsule (300 mg total) by mouth at bedtime. 11/22/14   Judi Saa, DO  metoprolol tartrate (LOPRESSOR) 25 MG tablet TAKE ONE (1) TABLET BY MOUTH TWO (2) TIMES DAILY 08/23/13   Karie Schwalbe, MD  Multiple Vitamin (MULTIVITAMIN) tablet Take 1 tablet by mouth daily.      Historical Provider, MD  niacin (NIASPAN) 1000 MG CR tablet Take 1 tablet (1,000 mg total) by mouth at bedtime. 10/07/11   Karie Schwalbe, MD  oxyCODONE-acetaminophen (PERCOCET) 10-325 MG per tablet Take 1 tablet by mouth every 4 (four) hours as needed for pain.    Historical Provider, MD  ramipril (ALTACE) 10 MG capsule TAKE ONE CAPSULE BY MOUTH  DAILY 09/27/14   Karie Schwalbe, MD  SYNTHROID 125 MCG tablet Take 1 tablet (125 mcg total) by mouth daily. 07/12/14   Karie Schwalbe, MD   There were no vitals taken for this visit. Physical Exam  Constitutional: He appears well-developed and well-nourished.  HENT:  Head: Normocephalic and atraumatic.  Endotracheal tube in place.  Fogging noted  Eyes:  Pupils fixed and dilated  Neck: Neck supple.  Cardiovascular:  Pulses with CPR only  Pulmonary/Chest: No respiratory distress.  Bilateral breath sounds auscultated with bag tube ventilation  Abdominal: He exhibits no distension.  Musculoskeletal:  No deformity of extremities  Neurological:  Unresponsive.  GCS 3  Skin: Skin is warm and  dry. No rash noted.  Nursing note and vitals reviewed.   ED Course  IO LINE INSERTION Performed by: Azalia Bilis Authorized by: Azalia Bilis Consent: The procedure was performed in an emergent situation. Indications: medication administration and rapid vascular access Local anesthesia used: no Insertion site: right proximal tibia Site preparation: chlorhexidine Insertion device: 15 gauge IO needle Insertion: needle was inserted through the bony cortex Number of attempts: 1 Confirmation method: stability of the needle, easy infusion of fluids and aspiration of blood/marrow Secured with: tape       EMERGENCY DEPARTMENT [place] CARDIAC EXAM "Study: Limited Ultrasound of the heart and pericardium" INDICATIONS:Cardiac arrest Multiple views of the heart and pericardium were obtained in real-time with a multi-frequency probe. PERFORMED ZO:XWRUEA IMAGES ARCHIVED?: Yes FINDINGS: No pericardial effusion and No cardiac activity LIMITATIONS:  Emergent procedure VIEWS USED: Subcostal 4 chamber INTERPRETATION: Cardiac activity absent no pericardial effusion present     Cardiopulmonary Resuscitation (CPR) Procedure Note Directed/Performed by: Lyanne Co I personally directed ancillary staff and/or performed CPR in an effort to regain return of spontaneous circulation and to maintain cardiac, neuro and systemic perfusion.     Labs Review Labs Reviewed - No data to display  Imaging Review     EKG Interpretation None      MDM   Final diagnoses:  Cardiac arrest  \  Patient remains asystolic in the emergency department despite appropriate ACLS management.  Time of death 63.  Total of nearly 50 minutes of CPR without return of spontaneous circulation.  Found in asystole.  3:19 PM Spoke with Raiford Noble, our medical examiner who declined the case as a medical examiner case.     Azalia Bilis, MD 01/03/2015 [phone]

## 2015-01-26 NOTE — Code Documentation (Addendum)
Time of Death 65 called by Dr. Patria Mane. No cardiac activity noted with ultrasound.

## 2015-01-26 NOTE — ED Notes (Addendum)
Per ems- pt was LSN at 1030 by his wife, she came home at 1400 to find him unresponsive. EMS reports was initially warm to touch but asystole on monitor. 7.0 ET tube, 40-45 capnography given 11 epi. Total 45 mins cpr, asystole entire time. Came in on lucas, given 1 L cold saline. CBG 280. Had back surgery yesterday.

## 2015-01-26 NOTE — Op Note (Signed)
NAME:  Darin [name], ISHIDA NO.:  [id number]  MEDICAL RECORD NO.:  [id number]  LOCATION:  [facility]                         FACILITY:  [facility]  PHYSICIAN:  Estill Bamberg, MD      DATE OF BIRTH:  July 23, 1940  DATE OF PROCEDURE:  12/28/2014 DATE OF DISCHARGE:  12/28/2014                              OPERATIVE REPORT   PREOPERATIVE DIAGNOSES: 1. Left-sided S1 radiculopathy. 2. Large left L5-S1 disk herniation.  POSTOPERATIVE DIAGNOSES: 1. Left-sided S1 radiculopathy. 2. Large left L5-S1 disk herniation.  PROCEDURE:  Left-sided L5-S1 microdiskectomy.  SURGEON:  Estill Bamberg, MD  ASSISTANT:  Jason Coop, PA-C  ANESTHESIA:  General endotracheal anesthesia.  COMPLICATIONS:  None.  DISPOSITION:  Stable.  ESTIMATED BLOOD LOSS:  Minimal.  INDICATIONS FOR SURGERY:  Briefly, Mr. [name] is a very pleasant 74 year old male, who did present to me with 2 month history of severe pain in the left leg.  An MRI did reveal what appeared to be an acute left L5-S1 large disk herniation.  The patient did fail conservative care, and did continue to have pain.  We therefore did discuss proceeding with the procedure reflected above.  OPERATIVE DETAILS:  On December 28, 2014, the patient was brought to surgery and general endotracheal anesthesia was administered.  The patient was placed prone on a well-padded flat Jackson bed with a spinal frame.  Antibiotics were given.  The back was prepped and draped, and a time-out procedure was performed.  A midline incision was made overlying the L5-S1 intervertebral space.  The fascia was incised in a curvilinear fashion adjacent to the midline on the left side.  A self-retaining retractor was placed.  A fluoroscopic view did confirm the appropriate operative level.  Using a high-speed bur, I did remove the inferior and medial aspect of the L5 lamina.  The ligamentum flavum was identified and removed.  The traversing S1 nerve was identified and  readily noted to be under substantial tension.  I was able to develop a plane between the S1 nerve and the very large herniated disk fragment immediately anterior to it.  The nerve was then swept medially.  I did probe the herniation and there were 3 large fragments that were readily removed. I did explore the epidural space for any additional disk fragments causing compression.  There were additional minor disk fragments, none of which were removed.  In this fashion, I was able to entirely decompress the lateral recess.  The wound was copiously irrigated. Bleeding was controlled using Surgiflo.  The fascia was then closed using #1 Vicryl.  The wound was then closed in layers using 2-0 Vicryl followed by 3-0 Monocryl.  Benzoin and Steri-Strips were applied followed by sterile dressing.  All instrument counts were correct at the termination of the procedure.  Of note, Jason Coop was my assistant throughout surgery, and did aid in retraction, suctioning, and closure.     Estill Bamberg, MD     MD/MEDQ  D:  12/28/2014  T:  07-Jan-2015  Job:  [id number]  cc:   Karie Schwalbe, MD

## 2015-01-26 NOTE — Progress Notes (Addendum)
Responded to CPR in progress. Per ems- pt was LSN at 1030 by his wife, she came home at 1400 to find him unresponsive.  Patient deceased.  Wife supported by host of family,Pastor and friends.  Provided emotional, spiritual and grief support to wife and family.  Remained with family until their departure. Promoted information sharing between family and staff.   01/07/15 1500  Clinical Encounter Type  Visited With Patient;Family;Health care provider  Visit Type Initial;Spiritual support;Death;ED  Referral From Nurse  Spiritual Encounters  Spiritual Needs Prayer;Emotional;Grief support  Stress Factors  Patient Stress Factors Exhausted;Loss  Family Stress Factors Exhausted  Venida Jarvis, [place] , page [phone]

## 2015-01-26 NOTE — Progress Notes (Signed)
Rt responded to Trauma C for CPR in progress. Pt being manually ventilated throughout code with bilateral breath sounds from manual ventilation.

## 2015-01-26 DEATH — deceased

## 2015-07-19 ENCOUNTER — Encounter: Payer: Commercial Managed Care - HMO | Admitting: Internal Medicine

## 2015-12-01 NOTE — Telephone Encounter (Signed)
error 

## 2016-01-04 IMAGING — CR DG CHEST 2V
2 series · 2 of 2 positions shown · non-contrast
Comparison: PA and lateral chest x-ray September 29, 2008

CLINICAL DATA: Preoperative exam prior to lumbar spine surgery ;
history of coronary artery disease with stent placement ; history of
tobacco use discontinued 16 years ago.

EXAM:
CHEST  2 VIEW

[w chest pa]
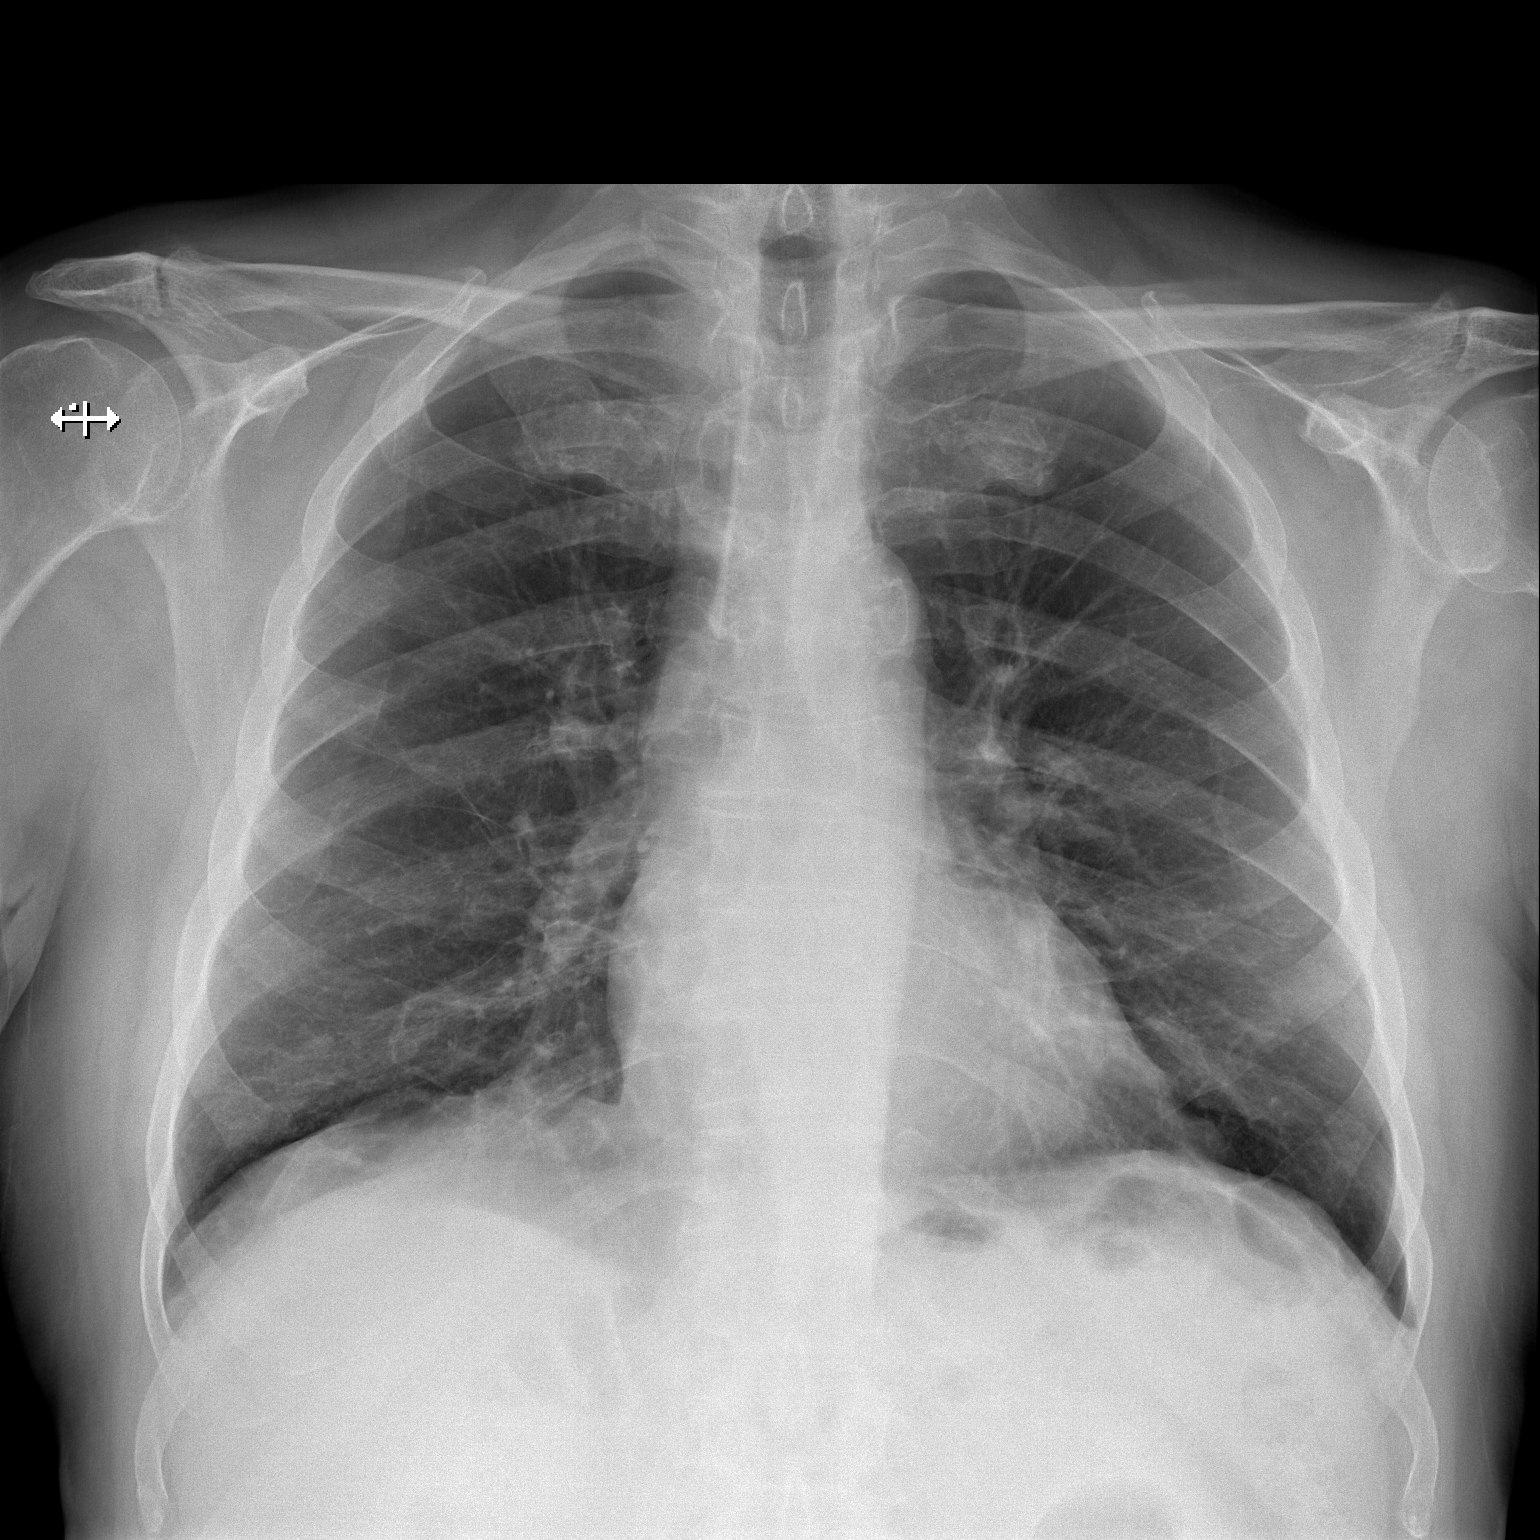

[w chest lat]
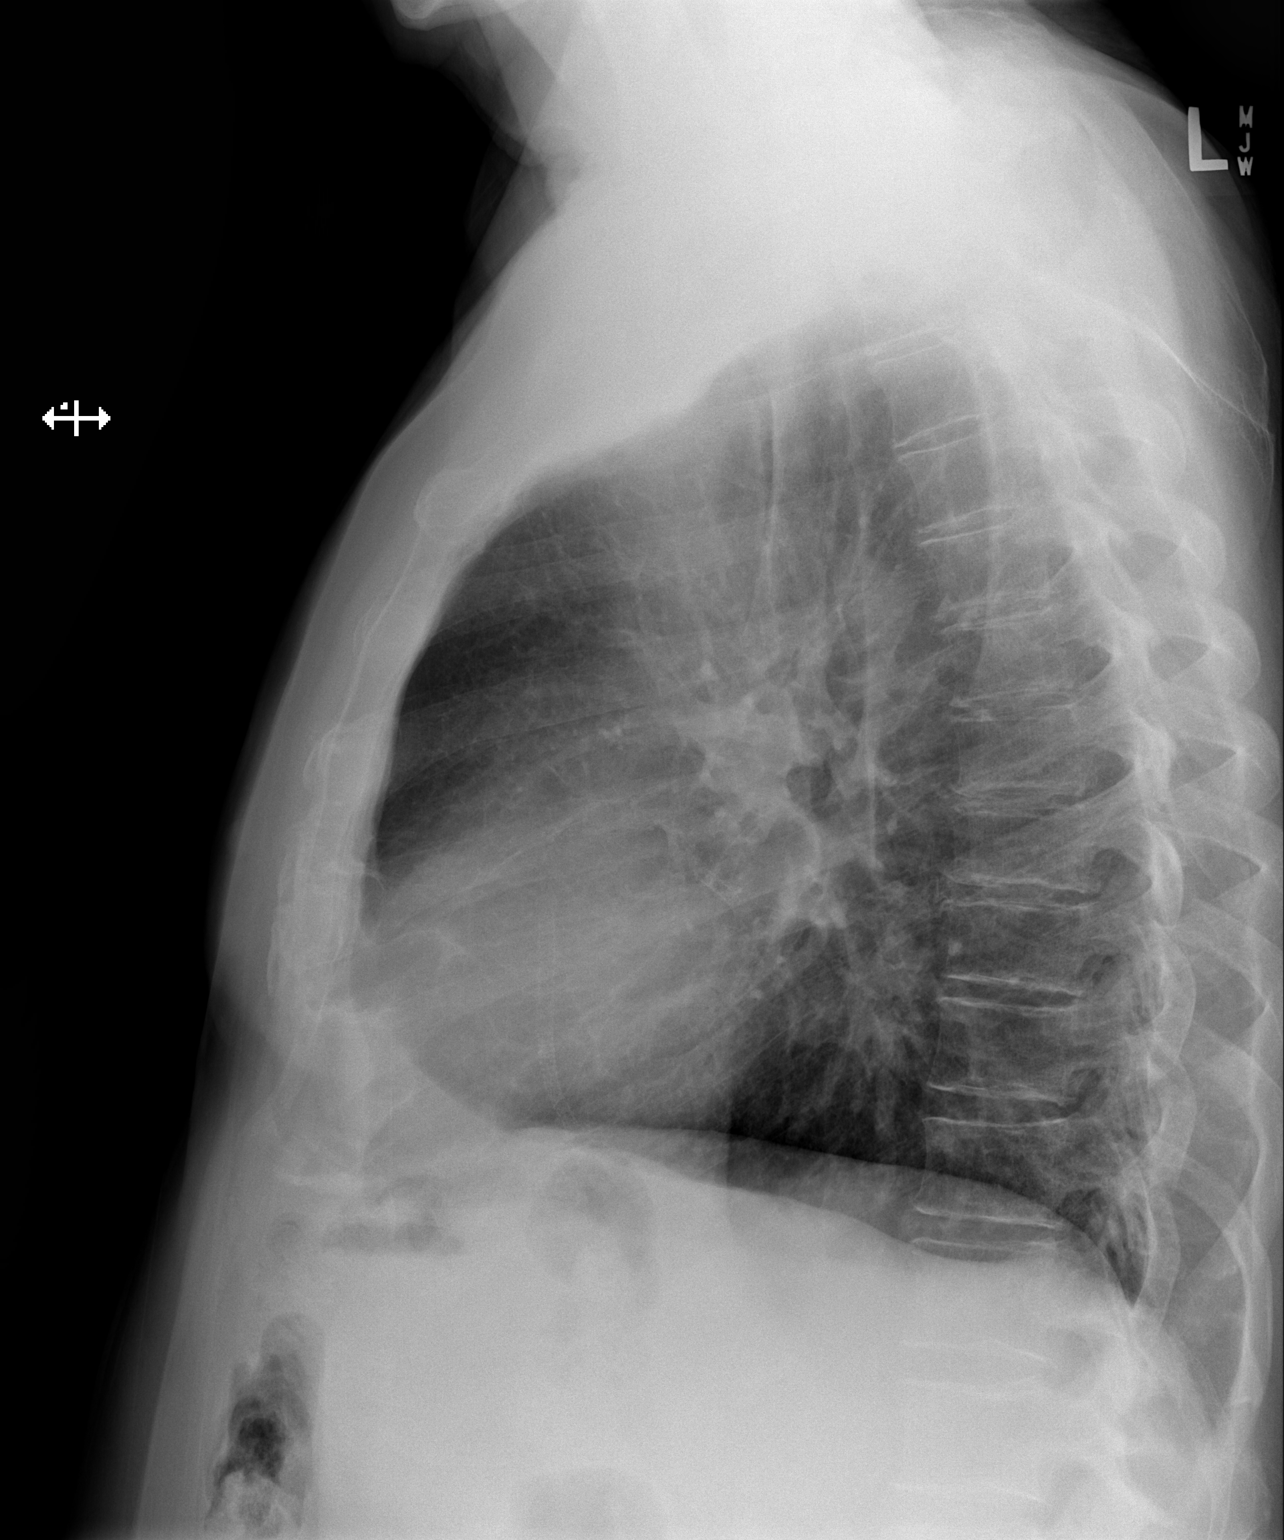

[2 of 2 positions shown; findings below may reference images not displayed]

FINDINGS: The lungs are adequately inflated. There is no focal infiltrate.
There is no pleural effusion. The heart and pulmonary vascularity
are normal. Two coronary stents are visible. The mediastinum is
normal in width. The bony thorax exhibits no acute abnormality.
IMPRESSION: There is no active cardiopulmonary disease.

## 2016-01-09 IMAGING — CR DG LUMBAR SPINE 2-3V
2 series · 2 of 2 positions shown · non-contrast
Comparison: 11/14/2014

CLINICAL DATA: Laminectomy scratch head lumbar decompression L5-S1

EXAM:
LUMBAR SPINE - 2-3 VIEW

[lateral (1 of 2)]
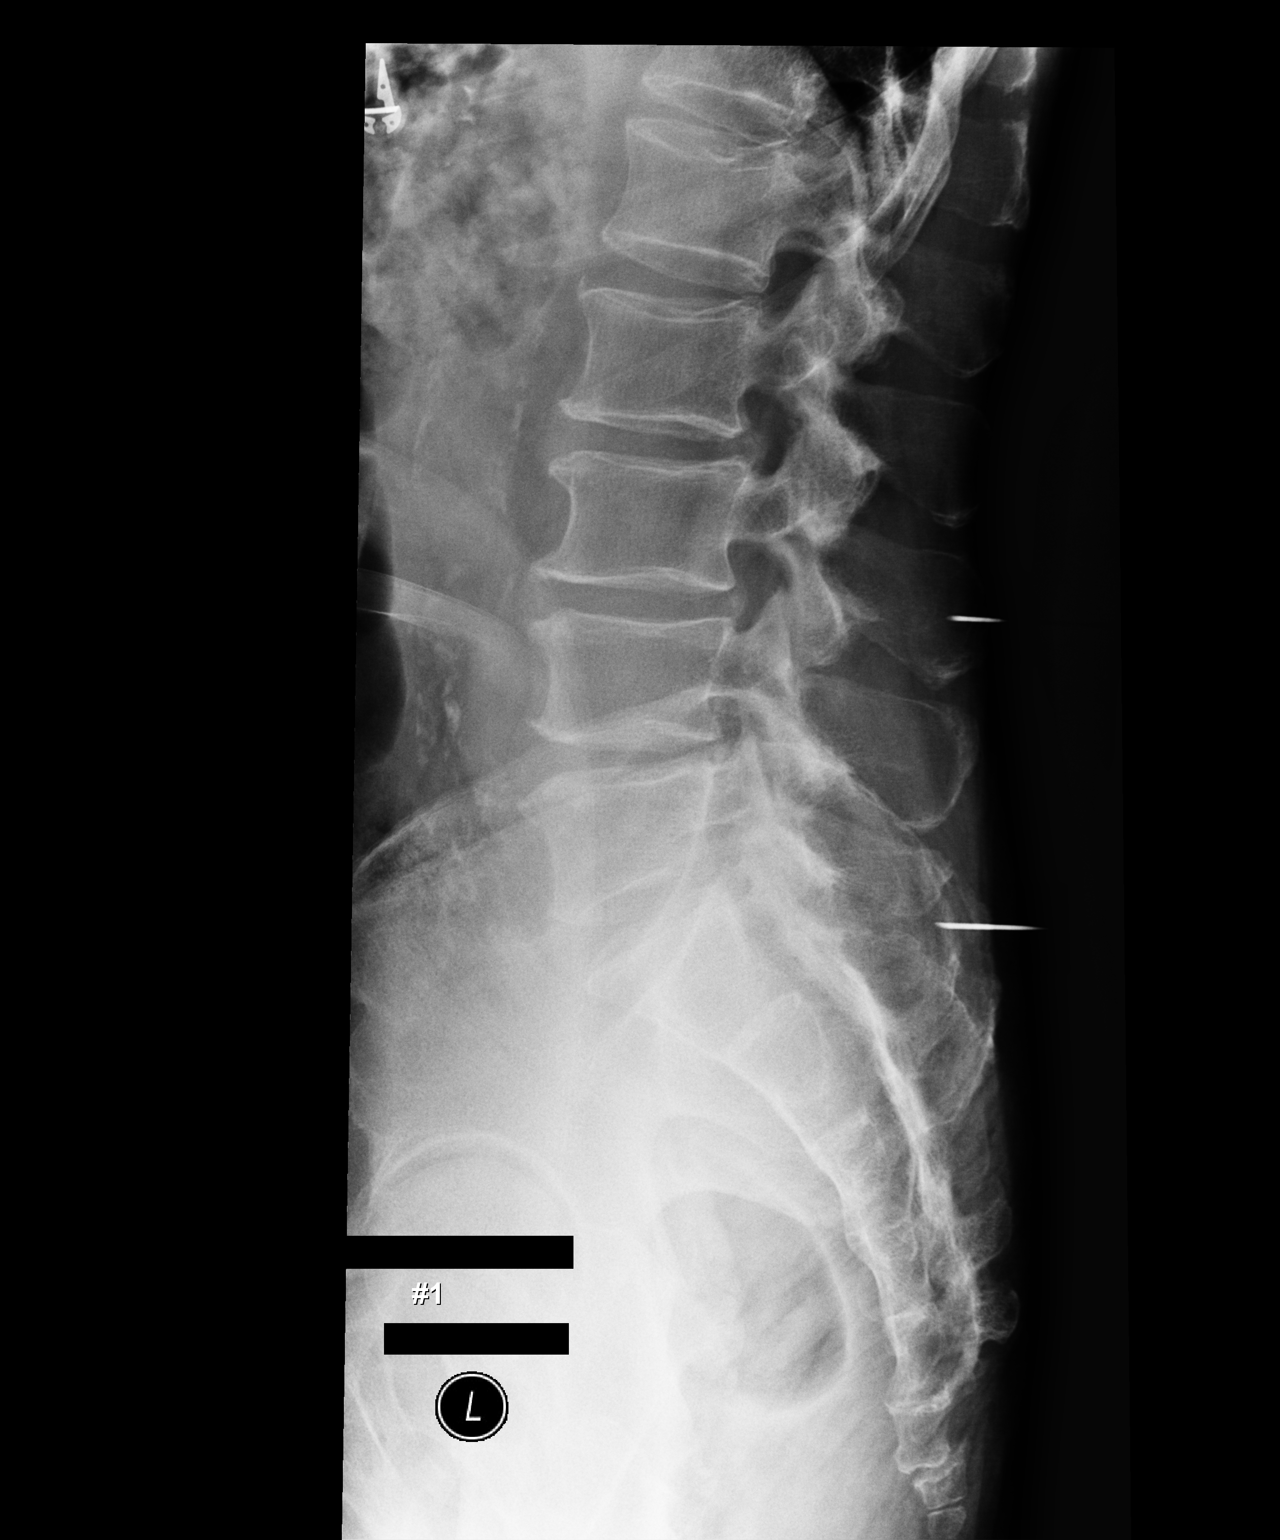

[lateral (2 of 2)]
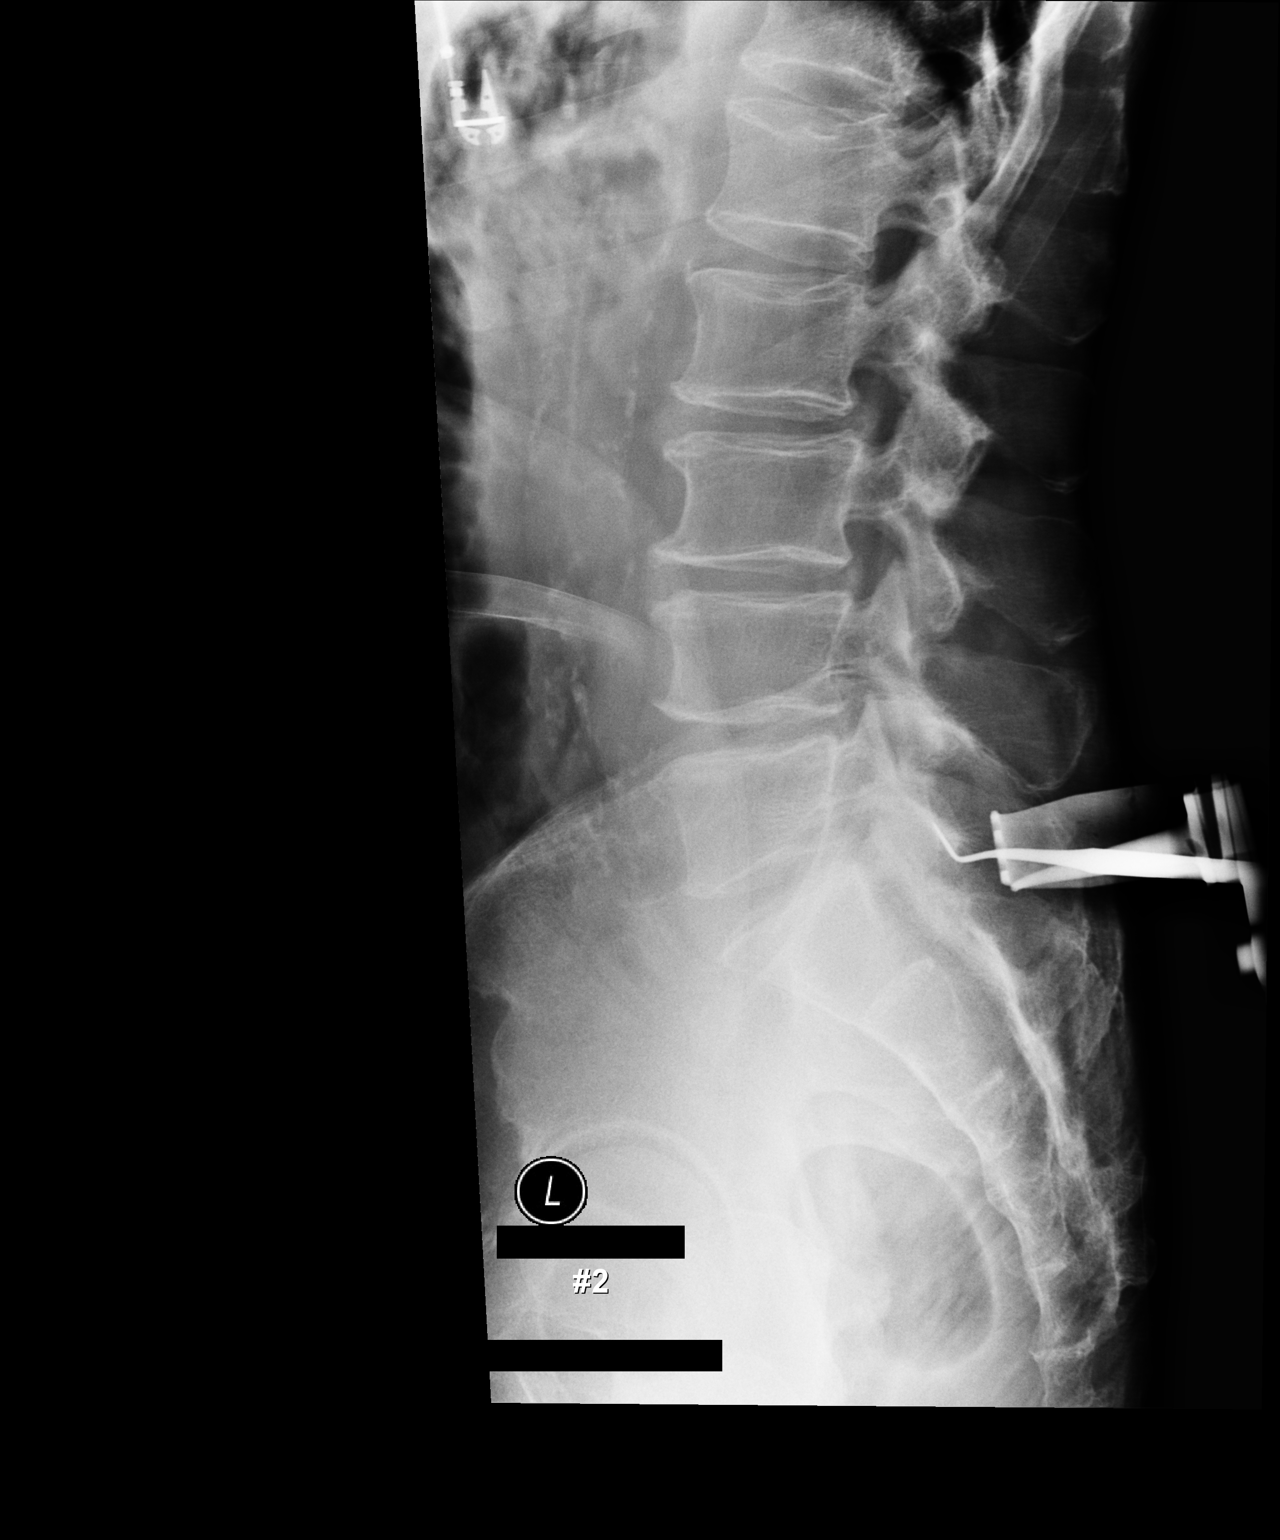

[2 of 2 positions shown; findings below may reference images not displayed]

FINDINGS: First lateral intraoperative image demonstrates posterior needles
directed at the S1 vertebral body and along the spinous process of
L3.

Second lateral intraoperative image demonstrates posterior surgical
instruments at the L5-S1 level.
IMPRESSION: Intraoperative localization as above.
